# Patient Record
Sex: Female | Born: 1980 | Marital: Single | Smoking: Current some day smoker
Health system: Southern US, Community
[De-identification: ages and names within clinical notes are randomized; demographics above are authoritative.]

## PROBLEM LIST (undated history)

## (undated) DIAGNOSIS — A6000 Herpesviral infection of urogenital system, unspecified: Secondary | ICD-10-CM

## (undated) DIAGNOSIS — N390 Urinary tract infection, site not specified: Secondary | ICD-10-CM

## (undated) DIAGNOSIS — E559 Vitamin D deficiency, unspecified: Secondary | ICD-10-CM

## (undated) HISTORY — PX: NO PAST SURGERIES: SHX2092

## (undated) HISTORY — DX: Urinary tract infection, site not specified: N39.0

## (undated) HISTORY — DX: Vitamin D deficiency, unspecified: E55.9

## (undated) HISTORY — DX: Herpesviral infection of urogenital system, unspecified: A60.00

---

## 2007-02-01 ENCOUNTER — Observation Stay: Payer: Self-pay

## 2007-03-11 ENCOUNTER — Observation Stay: Payer: Self-pay

## 2007-03-19 ENCOUNTER — Observation Stay: Payer: Self-pay | Admitting: Unknown Physician Specialty

## 2007-03-20 ENCOUNTER — Observation Stay: Payer: Self-pay

## 2007-03-29 ENCOUNTER — Inpatient Hospital Stay: Payer: Self-pay | Admitting: Obstetrics & Gynecology

## 2011-08-22 DIAGNOSIS — E559 Vitamin D deficiency, unspecified: Secondary | ICD-10-CM

## 2011-08-22 HISTORY — DX: Vitamin D deficiency, unspecified: E55.9

## 2014-11-02 ENCOUNTER — Inpatient Hospital Stay: Payer: Self-pay

## 2014-11-11 ENCOUNTER — Inpatient Hospital Stay: Payer: Self-pay

## 2014-11-11 LAB — CBC WITH DIFFERENTIAL/PLATELET
BASOS ABS: 0.1 10*3/uL (ref 0.0–0.1)
BASOS PCT: 0.8 %
Eosinophil #: 0.1 10*3/uL (ref 0.0–0.7)
Eosinophil %: 0.7 %
HCT: 37.3 % (ref 35.0–47.0)
HGB: 11.9 g/dL — AB (ref 12.0–16.0)
LYMPHS PCT: 14.7 %
Lymphocyte #: 2 10*3/uL (ref 1.0–3.6)
MCH: 25.1 pg — ABNORMAL LOW (ref 26.0–34.0)
MCHC: 31.8 g/dL — AB (ref 32.0–36.0)
MCV: 79 fL — ABNORMAL LOW (ref 80–100)
MONOS PCT: 7.1 %
Monocyte #: 1 x10 3/mm — ABNORMAL HIGH (ref 0.2–0.9)
NEUTROS PCT: 76.7 %
Neutrophil #: 10.3 10*3/uL — ABNORMAL HIGH (ref 1.4–6.5)
Platelet: 298 10*3/uL (ref 150–440)
RBC: 4.74 10*6/uL (ref 3.80–5.20)
RDW: 14.3 % (ref 11.5–14.5)
WBC: 13.4 10*3/uL — ABNORMAL HIGH (ref 3.6–11.0)

## 2014-11-13 LAB — HEMATOCRIT: HCT: 32.5 % — ABNORMAL LOW (ref 35.0–47.0)

## 2014-12-29 NOTE — H&P (Signed)
L&D Evaluation:  History:  HPI -CC: worsening UCs -HPI: 34 y/o G3P1011 @ 40/0 (dated by 12wk u/s) with above CC. Preg c/b h/o UTI and neg TOC.  No s/s of LOF, decreased FM. Feels UCs q2-2243m   Patient's Medical History No Chronic Illness   Patient's Surgical History none   Medications Pre Natal Vitamins   Allergies NKDA   Social History none   Exam:  Vital Signs AF VS normal and stable   General no apparent distress   Mental Status clear   Abdomen gravid, non-tender   Estimated Fetal Weight [place]: 3600gm   Fetal Position cephalic   Pelvic 5-6/80/-1/BOW intact, per RN at [facility]   FHT 140 baseline, +accels, no decels, mod var   Ucx q2-3   Impression:  Impression active labor   Plan:  Comments *IUP: category I tracing with accels *labor: pt would like epidural. consider arom after placement -largest prior was 5lbs 12oz in 2008 -anticipate SVD *analgesia: epidural once labs are back. IV prns in the interim  O pos/RI/VI/rpr pending/hiv neg/hepB neg/tdap UTD/pap neg 2015   Electronic Signatures: [place] BingPickens, Ellwood Steidle (MD)  (Signed 23-Mar-16 20:14)  Authored: L&D Evaluation   Last Updated: 23-Mar-16 20:14 by [place] BingPickens, Wray Goehring (MD)

## 2014-12-29 NOTE — H&P (Signed)
L&D Evaluation:  History Expanded:  HPI 34 yo G3 P1011 with EDD of 11/11/14 per 12 wk US, presents at 4549w5d with c/o contractions since 0500 this am. Denies VB, LOF or decreased FM. [facility], early [facility] to care, + UTIs, mild anemia treated with Fe. Prior SVD of 5# 14oz female infant.   Blood Type (Maternal) O positive   Group B Strep Results Maternal (Result >5wks must be treated as unknown) negative   Maternal HIV Negative   Maternal Syphilis Ab Nonreactive   Maternal Varicella Immune   Rubella Results (Maternal) immune   Presents with contractions   Patient's Medical History No Chronic Illness   Patient's Surgical History none   Medications Pre Natal Vitamins   Allergies NKDA   Social History none   ROS:  ROS see HPI   Exam:  Vital Signs stable   General no apparent distress   Mental Status clear   Chest clear   Heart normal sinus rhythm   Abdomen gravid, tender with contractions   Estimated Fetal Weight Average for gestational age   Fetal Position vertex   Pelvic no external lesions, 4/75/-1 (previously 3 cm per RN)   Mebranes Intact   FHT normal rate with no decels, category 1 tracing - baseline 130, mod variability, + accels   Ucx regular   Impression:  Impression active labor   Plan:  Comments Admit for labor IV fluids and labs - may have IV stadol or epidural when desired (only requests heat pack for back for now)  Anticipate SVD   Electronic Signatures: Laquonda Welby, Marta Lamasamara K (CNM)  (Signed 14-Mar-16 09:39)  Authored: L&D Evaluation   Last Updated: 14-Mar-16 09:39 by Vella KohlerBrothers, Caria Transue K (CNM)

## 2015-12-10 LAB — HM PAP SMEAR: HM PAP: NEGATIVE

## 2016-12-13 ENCOUNTER — Ambulatory Visit (INDEPENDENT_AMBULATORY_CARE_PROVIDER_SITE_OTHER): Payer: Medicaid Other | Admitting: Certified Nurse Midwife

## 2016-12-13 ENCOUNTER — Encounter: Payer: Self-pay | Admitting: Certified Nurse Midwife

## 2016-12-13 VITALS — BP 110/60 | HR 71 | Ht 60.0 in | Wt 129.0 lb

## 2016-12-13 DIAGNOSIS — Z124 Encounter for screening for malignant neoplasm of cervix: Secondary | ICD-10-CM

## 2016-12-13 DIAGNOSIS — Z01419 Encounter for gynecological examination (general) (routine) without abnormal findings: Secondary | ICD-10-CM

## 2016-12-13 DIAGNOSIS — Z0000 Encounter for general adult medical examination without abnormal findings: Secondary | ICD-10-CM | POA: Diagnosis not present

## 2016-12-13 MED ORDER — LO LOESTRIN FE 1 MG-10 MCG / 10 MCG PO TABS: 1.0000 | ORAL_TABLET | Freq: Every day | ORAL | 11 refills | Status: DC

## 2016-12-13 NOTE — Progress Notes (Signed)
Gynecology Annual Exam  PCP: No PCP Per Patient  Chief Complaint:  Chief Complaint  Patient presents with  . Gynecologic Exam    History of Present Illness: Cassie Wade is a 36 year old Asian female, G3 P2012, who presents for her annual exam. Her menses are regular and her LMP was 11/09/2016. They occur every 28 days, they last 3-4 days, are a light flow since switching to LO Loestrin  She has occasional breakthrough spotting and had no withdrawal bleed in December. She reports mild dysmenorrhea. She uses Motrin  with symptomatic relief. She reports that her premenstrual headaches have resolved with changing her pills to LoLoestrin from Loestrin 1/20. The patient's past medical history is non-contributory.  Since her last gyn exam 12/10/2015 , she has begun to smoke again. She smokes socially, 2-3 cigarettes if she goes out or to a party. She had quit smoking in 2015 x 2 years She is sexually active. She is currently using LoLoestrin Fe for contraception.  Her most recent pap smear was obtained 12/09/2016 and was negative/ neg HRHPV.  Mammogram is not applicable.  There is no family history of breast cancer.  There is no family history of ovarian cancer.  The patient does do monthly self breast exams.  The patient does smoke occasionally. Is not smoking on a daily basis. The patient does drink occasionally.  The patient does not use illegal drugs.  The patient is active, but does not exercise.  The patient does get adequate calcium in her diet.  She had a recent cholesterol screen in 2016 that was normal.    Review of Systems: Review of Systems  Constitutional: Negative for chills, fever and weight loss.  HENT: Negative for congestion, sinus pain and sore throat.   Eyes: Negative for blurred vision and pain.  Respiratory: Negative for hemoptysis, shortness of breath and wheezing.   Cardiovascular: Negative for chest pain, palpitations and leg swelling.    Gastrointestinal: Negative for abdominal pain, blood in stool, diarrhea, heartburn, nausea and vomiting.  Genitourinary: Negative for dysuria, frequency, hematuria and urgency.       Positive BTB on pills, positive for dysmenorrhea  Musculoskeletal: Negative for back pain, joint pain and myalgias.  Skin: Negative for itching and rash.  Neurological: Negative for dizziness, tingling and headaches.  Endo/Heme/Allergies: Negative for environmental allergies and polydipsia. Does not bruise/bleed easily.       Negative for hirsutism   Psychiatric/Behavioral: Negative for depression. The patient is not nervous/anxious and does not have insomnia.     Past Medical History:  Past Medical History:  Diagnosis Date  . Vitamin D deficiency 2013    Past Surgical History:  Past Surgical History:  Procedure Laterality Date  . NO PAST SURGERIES       Family History:  Family History  Problem Relation Age of Onset  . Diabetes Maternal Grandmother   . Hyperlipidemia Maternal Grandmother   . Diabetes Maternal Grandfather   . Hypertension Maternal Grandfather     Social History:  Social History   Social History  . Marital status: Single    Spouse name: N/A  . Number of children: 2  . Years of education: N/A   Occupational History  . Sales support/purchasing    Social History Main Topics  . Smoking status: Current Some Day Smoker    Years: 10.00    Types: Cigarettes  . Smokeless tobacco: Never Used     Comment: occasionally smokes 2-3 cigarettes  . Alcohol  use Yes  . Drug use: No  . Sexual activity: Yes    Birth control/ protection: Pill   Other Topics Concern  . Not on file   Social History Narrative  . No narrative on file    Allergies:  No Known Allergies  Medications: Prior to Admission medications   Medication Sig Start Date End Date Taking? Authorizing Provider  LO LOESTRIN FE 1 MG-10 MCG / 10 MCG tablet  11/23/16  Yes Historical Provider, MD    Physical  Exam Vitals: Blood pressure 110/60, pulse 71, height 5' (1.524 m), weight 129 lb (58.5 kg), BMI 25.19 kg/m2, last menstrual period 11/09/2016.  General: pleasant Asian female in NAD, well developed, well nourished HEENT: normocephalic, anicteric Thyroid: no enlargement, no palpable nodules Pulmonary: No increased work of breathing, CTAB Cardiovascular: RRR without murmur Breast: Breast symmetrical, no tenderness, no palpable nodules or masses, no skin or nipple retraction present, no nipple discharge.  No axillary, infraclavicular, or supraclavicular lymphadenopathy. Abdomen: soft, non-tender, non-distended.  Umbilicus without lesions.  No hepatomegaly or masses palpable. No evidence of hernia  Genitourinary:  External: Normal external female genitalia.  Normal urethral meatus, normal Bartholin's and Skene's glands.    Vagina: Normal vaginal mucosa, no evidence of prolapse.    Cervix: Grossly normal in appearance, no bleeding, NT  Uterus: RV, NSSC, mobile, NT  Adnexa: no adnexal masses, NT  Rectal: deferred  Lymphatic: no evidence of inguinal lymphadenopathy Extremities: no edema, erythema, or tenderness Neurologic: Grossly intact Psychiatric: mood appropriate, affect full      Assessment: 36 y.o. Z6X0960 well woman exam   Plan:   1) Recommend monthly SBE  2) Pap done  3) Contraception - Refill LoLoestrin Fe #1/ RF x11  4) Routine healthcare maintenance including cholesterol, diabetes screening discussed UTD   Farrel Conners, CNM

## 2016-12-27 LAB — IGP,RFX APTIMA HPV ALL PTH: PAP SMEAR COMMENT: 0

## 2016-12-29 ENCOUNTER — Telehealth: Payer: Self-pay | Admitting: Certified Nurse Midwife

## 2016-12-29 ENCOUNTER — Other Ambulatory Visit: Payer: Self-pay | Admitting: Certified Nurse Midwife

## 2016-12-29 MED ORDER — LO LOESTRIN FE 1 MG-10 MCG / 10 MCG PO TABS: 1.0000 | ORAL_TABLET | Freq: Every day | ORAL | 11 refills | Status: DC

## 2016-12-29 MED ORDER — NORETHIN-ETH ESTRAD-FE BIPHAS 1 MG-10 MCG / 10 MCG PO TABS: 1.0000 | ORAL_TABLET | Freq: Every day | ORAL | 11 refills | Status: DC

## 2016-12-29 MED ORDER — METRONIDAZOLE 500 MG PO TABS: ORAL_TABLET | ORAL | 0 refills | Status: DC

## 2016-12-29 NOTE — Telephone Encounter (Signed)
rx's faxed to pharmacy, unable to e-prescribe.

## 2016-12-29 NOTE — Addendum Note (Signed)
Addended by: Reather LittlerUTHUS, Takahiro Godinho D on: 12/29/2016 09:30 AM   Modules accepted: Orders

## 2016-12-29 NOTE — Telephone Encounter (Signed)
Pap returned NIL with Trichomonas. Will prescribe Flagyl for patient and partner. Advised to take with food and no alcohol for at least 3 days. Patient also need refill of BCPS

## 2017-01-19 ENCOUNTER — Telehealth: Payer: Self-pay

## 2017-01-19 DIAGNOSIS — A6000 Herpesviral infection of urogenital system, unspecified: Secondary | ICD-10-CM

## 2017-01-19 HISTORY — DX: Herpesviral infection of urogenital system, unspecified: A60.00

## 2017-01-19 NOTE — Telephone Encounter (Signed)
Pt calling - was seen by [facility] for AEX recently, had bacterial inf. Pt has questions about that.  Please call soon.  [phone].

## 2017-01-19 NOTE — Telephone Encounter (Signed)
Please advise. Thank you

## 2017-01-22 NOTE — Telephone Encounter (Signed)
Left message to call me. No answer at this time.

## 2017-01-23 ENCOUNTER — Ambulatory Visit (INDEPENDENT_AMBULATORY_CARE_PROVIDER_SITE_OTHER): Payer: Medicaid Other | Admitting: Obstetrics and Gynecology

## 2017-01-23 ENCOUNTER — Encounter: Payer: Self-pay | Admitting: Obstetrics and Gynecology

## 2017-01-23 VITALS — BP 120/70 | HR 102 | Ht 60.0 in | Wt 127.0 lb

## 2017-01-23 DIAGNOSIS — A6004 Herpesviral vulvovaginitis: Secondary | ICD-10-CM

## 2017-01-23 DIAGNOSIS — N898 Other specified noninflammatory disorders of vagina: Secondary | ICD-10-CM | POA: Diagnosis not present

## 2017-01-23 MED ORDER — VALACYCLOVIR HCL 1 G PO TABS: 1000.0000 mg | ORAL_TABLET | Freq: Two times a day (BID) | ORAL | 0 refills | Status: DC

## 2017-01-23 NOTE — Progress Notes (Signed)
Chief Complaint  Patient presents with  . Vaginitis    HPI:      [name] is a 36 y.o. Z6X0960 who LMP was Patient's last menstrual period was 01/03/2017 (exact date)., presents today for dysuria with increased vag d/c and burning since last wk. She was seen at urgent care and diagnosed with UTI. She was given cipro and has been taking that. She had nausea, fever, flu like sx when she saw urgent care. Vaginal discomfort started a day later. She treated with diflucan last night, but is concerned about sx. She denies any new sex partners, no hx of herpes. Patient treated 5/18 for trich on pap from 4/18 annual. Partner was treated, too.   There are no active problems to display for this patient.   Family History  Problem Relation Age of Onset  . Diabetes Maternal Grandmother   . Hyperlipidemia Maternal Grandmother   . Diabetes Maternal Grandfather   . Hypertension Maternal Grandfather     Social History   Social History  . Marital status: Single    Spouse name: N/A  . Number of children: 2  . Years of education: N/A   Occupational History  . Sales support/purchasing    Social History Main Topics  . Smoking status: Current Some Day Smoker    Years: 10.00    Types: Cigarettes  . Smokeless tobacco: Never Used     Comment: occasionally smokes 2-3 cigarettes  . Alcohol use Yes  . Drug use: No  . Sexual activity: Yes    Birth control/ protection: Pill   Other Topics Concern  . Not on file   Social History Narrative  . No narrative on file     Current Outpatient Prescriptions:  .  cefdinir (OMNICEF) 300 MG capsule, Take 300 mg by mouth 2 (two) times daily., Disp: , Rfl:  .  acetaminophen (TYLENOL) 500 MG tablet, Take by mouth., Disp: , Rfl:  .  ibuprofen (ADVIL,MOTRIN) 200 MG tablet, Take by mouth., Disp: , Rfl:  .  metroNIDAZOLE (FLAGYL) 500 MG tablet, Take 2 grams (4 tablets) at one meal x 1 dose; can repeat if necessary., Disp: 8 tablet, Rfl: 0 .   Norethindrone-Ethinyl Estradiol-Fe Biphas (LO LOESTRIN FE) 1 MG-10 MCG / 10 MCG tablet, Take 1 tablet by mouth daily., Disp: 1 Package, Rfl: 11  Review of Systems  Constitutional: Negative for fever.  Gastrointestinal: Negative for blood in stool, constipation, diarrhea, nausea and vomiting.  Genitourinary: Positive for genital sores and vaginal pain. Negative for dyspareunia, dysuria, flank pain, frequency, hematuria, urgency, vaginal bleeding and vaginal discharge.  Musculoskeletal: Negative for back pain.  Skin: Negative for rash.     OBJECTIVE:   Vitals:  BP 120/70   Pulse (!) 102   Ht 5' (1.524 m)   Wt 127 lb (57.6 kg)   LMP 01/03/2017 (Exact Date)   BMI 24.80 kg/m   Physical Exam  Constitutional: She is oriented to person, place, and time. Vital signs are normal.  Genitourinary: Vulva exhibits erythema, lesion, rash and tenderness. Vagina exhibits lesion.  Genitourinary Comments: DIFFUSE ULCERATIVE, PAINFUL LESIONS BILAT LABIA MAJORA AND MINORA; FEW BLISTERS PRESENT  Neurological: She is alert and oriented to person, place, and time.  Vitals reviewed.   Assessment/Plan: Herpes simplex vulvovaginitis - By clinical exam/sx hx. One Swab culture. Rx valtrex. NSAIDs/sitz baths. Will f/u with results. Discussed partner testing/tx options/etiology. - Plan: Other/Misc lab test, valACYclovir (VALTREX) 1000 MG tablet  Vaginal lesion - Plan: Other/Misc lab  test, valACYclovir (VALTREX) 1000 MG tablet    Return if symptoms worsen or fail to improve.  Alicia B. Copland, PA-C 01/23/2017 2:04 PM

## 2017-01-24 NOTE — Telephone Encounter (Signed)
Talked with patient 6/5. Was seen by Urgent Care for dysuria and treated for a UTI with Ciprofloxacin, but now has tender sores on vulva. Advised to make appt to be seen to rule out HSV. Antifungal topically has not helped.

## 2017-01-30 ENCOUNTER — Telehealth: Payer: Self-pay

## 2017-01-30 DIAGNOSIS — A6004 Herpesviral vulvovaginitis: Secondary | ICD-10-CM

## 2017-01-30 MED ORDER — VALACYCLOVIR HCL 500 MG PO TABS: 500.0000 mg | ORAL_TABLET | Freq: Every day | ORAL | 8 refills | Status: DC

## 2017-01-30 NOTE — Telephone Encounter (Signed)
[facility] received results.

## 2017-01-30 NOTE — Telephone Encounter (Signed)
Pt was seen last week and was told should hear about results on Mon.  [phone]

## 2017-01-30 NOTE — Telephone Encounter (Signed)
I haven't received one swab results yet. [place] SinkRita, can you pls see if Archie Pattenonya has a faxed copy today. If not, pls call [facility] to get them. Thx

## 2017-01-30 NOTE — Telephone Encounter (Signed)
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 Pt already on valtrex 1 g BID. Complete 10 day course, then start valtrex 500 mg daily as preventive. Recommended partner testing. Questions answered. F/u prn.

## 2017-01-30 NOTE — Addendum Note (Signed)
Addended by: Althea GrimmerOPLAND, Twana Wileman B on: 01/30/2017 05:19 PM   Modules accepted: Orders

## 2017-02-05 ENCOUNTER — Encounter: Payer: Self-pay | Admitting: Obstetrics and Gynecology

## 2017-12-24 ENCOUNTER — Other Ambulatory Visit: Payer: Self-pay | Admitting: Certified Nurse Midwife

## 2017-12-25 ENCOUNTER — Telehealth: Payer: Self-pay

## 2017-12-25 MED ORDER — NORETHIN-ETH ESTRAD-FE BIPHAS 1 MG-10 MCG / 10 MCG PO TABS: 1.0000 | ORAL_TABLET | Freq: Every day | ORAL | 0 refills | Status: DC

## 2017-12-25 NOTE — Telephone Encounter (Signed)
Pt sched appt and needs refill.  [phone]  Refill eRx'd.  Pt aware.

## 2017-12-28 ENCOUNTER — Ambulatory Visit (INDEPENDENT_AMBULATORY_CARE_PROVIDER_SITE_OTHER): Payer: Medicaid Other | Admitting: Certified Nurse Midwife

## 2017-12-28 ENCOUNTER — Other Ambulatory Visit: Payer: Self-pay

## 2017-12-28 ENCOUNTER — Encounter: Payer: Self-pay | Admitting: Certified Nurse Midwife

## 2017-12-28 VITALS — BP 96/60 | HR 90 | Ht 60.0 in | Wt 136.0 lb

## 2017-12-28 DIAGNOSIS — Z124 Encounter for screening for malignant neoplasm of cervix: Secondary | ICD-10-CM

## 2017-12-28 DIAGNOSIS — N760 Acute vaginitis: Secondary | ICD-10-CM

## 2017-12-28 DIAGNOSIS — Z01419 Encounter for gynecological examination (general) (routine) without abnormal findings: Secondary | ICD-10-CM

## 2017-12-28 DIAGNOSIS — B9689 Other specified bacterial agents as the cause of diseases classified elsewhere: Secondary | ICD-10-CM

## 2017-12-28 DIAGNOSIS — Z113 Encounter for screening for infections with a predominantly sexual mode of transmission: Secondary | ICD-10-CM

## 2017-12-28 DIAGNOSIS — Z0000 Encounter for general adult medical examination without abnormal findings: Secondary | ICD-10-CM | POA: Diagnosis not present

## 2017-12-28 DIAGNOSIS — N898 Other specified noninflammatory disorders of vagina: Secondary | ICD-10-CM

## 2017-12-28 MED ORDER — METRONIDAZOLE 500 MG PO TABS: 500.0000 mg | ORAL_TABLET | Freq: Two times a day (BID) | ORAL | 0 refills | Status: AC

## 2017-12-28 MED ORDER — NORETHIN-ETH ESTRAD-FE BIPHAS 1 MG-10 MCG / 10 MCG PO TABS: 1.0000 | ORAL_TABLET | Freq: Every day | ORAL | 3 refills | Status: DC

## 2017-12-28 MED ORDER — VALACYCLOVIR HCL 500 MG PO TABS: ORAL_TABLET | ORAL | 1 refills | Status: DC

## 2017-12-28 NOTE — Progress Notes (Signed)
Gynecology Annual Exam  PCP: Patient, No Pcp Per  Chief Complaint:  Chief Complaint  Patient presents with  . Gynecologic Exam    No Complaints    History of Present Illness: Cassie Wade is a 37 year old Asian female, G3 P2012, who presents for her annual exam. Her menses are 1rregular and her LMP was 12/06/2017. She has irregular spotting. She denies dysmenorrhea. The patient's past medical history is non-contributory.  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 Has not had an outbreak since June 2018. Is not taking the Valtrex prophylactically. Has the same partner She is sexually active. She is currently using LoLoestrin Fe for contraception.  Her most recent pap smear was obtained 12/13/2016 and was NIL/ Trich+  Mammogram is not applicable.  There is no family history of breast cancer.  There is no family history of ovarian cancer.  The patient does do monthly self breast exams.  The patient does smoke occasionally. Is not smoking on a daily basis. The patient does drink occasionally, usually once/month..  The patient does not use illegal drugs.  The patient is active and walks for exercise.  The patient does get adequate calcium in her diet.  She had a recent cholesterol screen in 2016 that was normal.    Review of Systems: Review of Systems  Constitutional: Negative for chills, fever and weight loss.  HENT: Negative for congestion, sinus pain and sore throat.   Eyes: Negative for blurred vision and pain.  Respiratory: Negative for hemoptysis, shortness of breath and wheezing.   Cardiovascular: Negative for chest pain, palpitations and leg swelling.  Gastrointestinal: Negative for abdominal pain, blood in stool, diarrhea, heartburn, nausea and vomiting.  Genitourinary: Negative for dysuria, frequency, hematuria and urgency.       Positive BTB on pills, positive for dysmenorrhea  Musculoskeletal: Negative for back pain,  joint pain and myalgias.  Skin: Negative for itching and rash.  Neurological: Negative for dizziness, tingling and headaches.  Endo/Heme/Allergies: Negative for environmental allergies and polydipsia. Does not bruise/bleed easily.       Negative for hirsutism   Psychiatric/Behavioral: Negative for depression. The patient is not nervous/anxious and does not have insomnia.     Past Medical History:  Past Medical History:  Diagnosis Date  . Genital herpes 01/2017   on culture  . UTI (urinary tract infection)   . Vitamin D deficiency 2013    Past Surgical History:  Past Surgical History:  Procedure Laterality Date  . NO PAST SURGERIES       Family History:  Family History  Problem Relation Age of Onset  . Diabetes Maternal Grandmother   . Hyperlipidemia Maternal Grandmother   . Diabetes Maternal Grandfather   . Hypertension Maternal Grandfather     Social History:  Social History   Socioeconomic History  . Marital status: Single    Spouse name: Not on file  . Number of children: 2  . Years of education: Not on file  . Highest education level: Not on file  Occupational History  . Occupation: Counselling psychologist  Social Needs  . Financial resource strain: Not on file  . Food insecurity:    Worry: Not on file    Inability: Not on file  . Transportation needs:    Medical: Not on file    Non-medical: Not on file  Tobacco Use  . Smoking status: Current Some Day Smoker    Years: 10.00  Types: Cigarettes  . Smokeless tobacco: Never Used  . Tobacco comment: occasionally smokes 2-3 cigarettes  Substance and Sexual Activity  . Alcohol use: Yes  . Drug use: No  . Sexual activity: Yes    Birth control/protection: Pill  Lifestyle  . Physical activity:    Days per week: 0 days    Minutes per session: Not on file  . Stress: Not at all  Relationships  . Social connections:    Talks on phone: Not on file    Gets together: Not on file    Attends religious  service: Not on file    Active member of club or organization: Not on file    Attends meetings of clubs or organizations: Not on file    Relationship status: Not on file  . Intimate partner violence:    Fear of current or ex partner: Not on file    Emotionally abused: Not on file    Physically abused: Not on file    Forced sexual activity: Not on file  Other Topics Concern  . Not on file  Social History Narrative  . Not on file    Allergies:  No Known Allergies  Medications: Prior to Admission medications   Medication Sig Start Date End Date Taking? Authorizing Provider  LO LOESTRIN FE 1 MG-10 MCG / 10 MCG tablet  11/23/16  Yes Historical Provider, MD   Also iron supplements and vitamin C Physical Exam Vitals: BP 96/60 (BP Location: Right Arm, Patient Position: Sitting, Cuff Size: Normal)   Pulse 90   Ht 5' (1.524 m)   Wt 136 lb (61.7 kg)   LMP 12/06/2017   BMI 26.56 kg/m   General: pleasant Asian female in NAD, well developed, well nourished HEENT: normocephalic, anicteric Thyroid: no enlargement, no palpable nodules Pulmonary: No increased work of breathing, CTAB Cardiovascular: RRR without murmur Breast: Breast symmetrical, no tenderness, no palpable nodules or masses, no skin or nipple retraction present, no nipple discharge.  No axillary, infraclavicular, or supraclavicular lymphadenopathy. Abdomen: soft, non-tender, non-distended.  Umbilicus without lesions.  No hepatomegaly or masses palpable. No evidence of hernia  Genitourinary:  External: Normal external female genitalia.  Normal urethral meatus, normal Bartholin's and Skene's glands.    Vagina: Normal vaginal mucosa, no evidence of prolapse, off white homogenous discharge.    Cervix: Grossly normal in appearance, friable with Pap, NT  Uterus: RV, NSSC, mobile, NT  Adnexa: no adnexal masses, NT  Rectal: deferred  Lymphatic: no evidence of inguinal lymphadenopathy Extremities: no edema, erythema, or  tenderness Neurologic: Grossly intact Psychiatric: mood appropriate, affect full  Results for orders placed or performed in visit on 12/28/17 (from the past 72 hour(s))  POCT Wet Prep Mell[place])     Status: Abnormal   Collection Time: 12/30/17  9:13 PM  Result Value Ref Range   Source Wet Prep POC vagina    WBC, Wet Prep HPF POC     Bacteria Wet Prep HPF POC  Few   BACTERIA WET PREP MORPHOLOGY POC     Clue Cells Wet Prep HPF POC Moderate (A) None   Clue Cells Wet Prep Whiff POC     Yeast Wet Prep HPF POC None    KOH Wet Prep POC     Trichomonas Wet Prep HPF POC Absent Absent      Assessment: 37 y.o. [id number] well woman exam Hx of HSV II Bacterial vaginosis  Plan:   1) Recommend monthly SBE  2) Paptima done  3)  Contraception - Refill LoLoestrin Fe #1/ RF x11  4) Routine healthcare maintenance including cholesterol, diabetes screening discussed UTD   5) Valtrex 500 mgm BID x 3-5 days prn outbreak. Flagyl 500 mgm BID x 7 days (no alcohol, with food)  Farrel Conners, CNM

## 2017-12-30 ENCOUNTER — Encounter: Payer: Self-pay | Admitting: Certified Nurse Midwife

## 2017-12-30 LAB — POCT WET PREP (WET MOUNT): TRICHOMONAS WET PREP HPF POC: ABSENT

## 2018-01-01 LAB — PAP IG, CT-NG, RFX HPV ALL
Chlamydia, Nuc. Acid Amp: NEGATIVE
Gonococcus by Nucleic Acid Amp: NEGATIVE
PAP Smear Comment: 0

## 2018-05-16 ENCOUNTER — Emergency Department
Admission: EM | Admit: 2018-05-16 | Discharge: 2018-05-16 | Disposition: A | Discharge status: Home / Self Care | Payer: Medicaid Other | Attending: Emergency Medicine | Admitting: Emergency Medicine

## 2018-05-16 ENCOUNTER — Other Ambulatory Visit: Payer: Self-pay

## 2018-05-16 ENCOUNTER — Encounter: Payer: Self-pay | Admitting: Emergency Medicine

## 2018-05-16 DIAGNOSIS — Z79899 Other long term (current) drug therapy: Secondary | ICD-10-CM | POA: Diagnosis not present

## 2018-05-16 DIAGNOSIS — R519 Headache, unspecified: Secondary | ICD-10-CM

## 2018-05-16 DIAGNOSIS — H938X3 Other specified disorders of ear, bilateral: Secondary | ICD-10-CM

## 2018-05-16 DIAGNOSIS — R51 Headache: Secondary | ICD-10-CM | POA: Insufficient documentation

## 2018-05-16 DIAGNOSIS — F17210 Nicotine dependence, cigarettes, uncomplicated: Secondary | ICD-10-CM | POA: Insufficient documentation

## 2018-05-16 DIAGNOSIS — L568 Other specified acute skin changes due to ultraviolet radiation: Secondary | ICD-10-CM

## 2018-05-16 MED ORDER — KETOROLAC TROMETHAMINE 10 MG PO TABS: 10.0000 mg | ORAL_TABLET | Freq: Three times a day (TID) | ORAL | 0 refills | Status: DC | PRN

## 2018-05-16 MED ORDER — PROCHLORPERAZINE MALEATE 5 MG PO TABS
5.0000 mg | ORAL_TABLET | Freq: Once | ORAL | Status: AC
  Administered 2018-05-16: 5 mg via ORAL
  Filled 2018-05-16: qty 1

## 2018-05-16 MED ORDER — PROCHLORPERAZINE MALEATE 5 MG PO TABS: 5.0000 mg | ORAL_TABLET | Freq: Four times a day (QID) | ORAL | 0 refills | Status: DC | PRN

## 2018-05-16 MED ORDER — ACETAMINOPHEN 500 MG PO TABS
1000.0000 mg | ORAL_TABLET | Freq: Once | ORAL | Status: AC
  Administered 2018-05-16: 1000 mg via ORAL
  Filled 2018-05-16: qty 2

## 2018-05-16 MED ORDER — KETOROLAC TROMETHAMINE 10 MG PO TABS
10.0000 mg | ORAL_TABLET | Freq: Once | ORAL | Status: AC
  Administered 2018-05-16: 10 mg via ORAL
  Filled 2018-05-16 (×2): qty 1

## 2018-05-16 NOTE — ED Triage Notes (Signed)
Pt comes into the ED via POV c/o migraine that started yesterday.  Patient has tried OTC medication with no relief.  Patient has photosensitivity, but denies any nausea.  Patient states that her ears feel stopped up as well.  Patient neurologically intact at this time and in NAD.

## 2018-05-16 NOTE — Discharge Instructions (Addendum)
Please return to the emergency department if you develop severe pain, lightheadedness or fainting, rash, stiff neck, vomiting, fever, or any other symptoms concerning to you.

## 2018-05-16 NOTE — ED Provider Notes (Signed)
[facility] Emergency Department Provider Note  ____________________________________________  Time seen: Approximately 5:04 PM  I have reviewed the triage vital signs and the nursing notes.   HISTORY  Chief Complaint Migraine    HPI Cassie Wade is a 37 y.o. female, otherwise healthy, presenting with headache.  The patient reports that yesterday she developed a throbbing sensation in the posterior head with ear fullness and photosensitivity.  She tried Excedrin Migraine and Tylenol Sinus, with some minimal improvement.  She was able to fall asleep, but continues to have a headache today.  She has not had any fevers or chills, neck stiffness, tick bites, rash.  Past Medical History:  Diagnosis Date  . Genital herpes 01/2017   on culture  . UTI (urinary tract infection)   . Vitamin D deficiency 2013    Patient Active Problem List   Diagnosis Date Noted  . Herpes simplex vulvovaginitis 01/23/2017    Past Surgical History:  Procedure Laterality Date  . NO PAST SURGERIES      Current Outpatient Rx  . Order #: [id number] Class: Historical Med  . Order #: [id number] Class: Historical Med  . Order #: [id number] Class: Print  . Order #: [id number] Class: Normal  . Order #: [id number] Class: Normal  . Order #: [id number] Class: Normal    Allergies Patient has no known allergies.  Family History  Problem Relation Age of Onset  . Diabetes Maternal Grandmother   . Hyperlipidemia Maternal Grandmother   . Diabetes Maternal Grandfather   . Hypertension Maternal Grandfather     Social History Social History   Tobacco Use  . Smoking status: Current Some Day Smoker    Years: 10.00    Types: Cigarettes  . Smokeless tobacco: Never Used  . Tobacco comment: occasionally smokes 2-3 cigarettes  Substance Use Topics  . Alcohol use: Yes  . Drug use: No    Review of Systems Constitutional: No fever/chills.  No lightheadedness or syncope.  No trauma.  No  tick bites. Eyes: Positive photosensitivity with mild bilateral blurry vision. ENT: No sore throat. No congestion or rhinorrhea.  No dental infection. Cardiovascular: Denies chest pain. Denies palpitations. Respiratory: Denies shortness of breath.  No cough. Gastrointestinal: No abdominal pain.  No nausea, no vomiting.  No diarrhea.  No constipation. Genitourinary: Negative for dysuria. Musculoskeletal: Negative for back pain. Skin: Negative for rash. Neurological: Positive for posterior throbbing headache. No focal numbness, tingling or weakness.  No changes in speech or mental status.  No difficulty walking.    ____________________________________________   PHYSICAL EXAM:  VITAL SIGNS: ED Triage Vitals [05/16/18 1405]  Enc Vitals Group     BP 134/84     Pulse Rate 84     Resp 16     Temp 98.2 F (36.8 C)     Temp Source Oral     SpO2 99 %     Weight 130 lb (59 kg)     Height 5' (1.524 m)     Head Circumference      Peak Flow      Pain Score 5     Pain Loc      Pain Edu?      Excl. in GC?     Constitutional: Alert and oriented.Answers questions appropriately.  Mildly uncomfortable with bright light. Eyes: Conjunctivae are normal.  EOMI. No scleral icterus.  Photosensitivity on exam. Head: Atraumatic. Nose: No congestion/rhinnorhea. Mouth/Throat: Mucous membranes are mildly dry.  The patient has no posterior pharyngeal erythema, tonsillar swelling or exudate.  She has symmetric posterior palate and the uvula is midline. EARS: TMs are clear without any erythema, bulging or fluid.  The canals are clear as well.  Neck: No stridor.  Supple.  No JVD.  No meningismus peer Cardiovascular: Normal rate, regular rhythm. No murmurs, rubs or gallops.  Respiratory: Normal respiratory effort.  No accessory muscle use or retractions. Lungs CTAB.  No wheezes, rales or ronchi. Gastrointestinal: Soft, nontender and nondistended.  No guarding or rebound.  No peritoneal  signs. Musculoskeletal: Moves all extremities well.  Neurologic:  A&Ox3.  Speech is clear.  Face and smile are symmetric.  EOMI.  Moves all extremities well. Skin:  Skin is warm, dry and intact. No rash noted. Psychiatric: Mood and affect are normal. Speech and behavior are normal.  Normal judgement.  ____________________________________________   LABS (all labs ordered are listed, but only abnormal results are displayed)  Labs Reviewed - No data to display ____________________________________________  EKG  Not indicated ____________________________________________  RADIOLOGY  No results found.  ____________________________________________   PROCEDURES  Procedure(s) performed: None  Procedures  Critical Care performed: No ____________________________________________   INITIAL IMPRESSION / ASSESSMENT AND PLAN / ED COURSE  Pertinent labs & imaging results that were available during my care of the patient were reviewed by me and considered in my medical decision making (see chart for details).  37 y.o. female, otherwise healthy, presenting with posterior headache that is throbbing, blurred vision and photosensitivity.  Overall, the patient is well-appearing, hemodynamically stable and afebrile.  It is possible that she has a headache or migraine, and she states that she has had headaches in the past but not migraines.  She is not giving any history that would be concerning for subarachnoid hemorrhage or intracranial bleed.  I do not see any evidence of meningitis today.  Venous sinus thrombosis is very unlikely.  We will initiate treatment with Toradol, Tylenol and Compazine, and reevaluate the patient for final disposition.  ----------------------------------------- 6:30 PM on 05/16/2018 -----------------------------------------  The patient's headache has resolved at this time.  She is able to ambulate and drink fluids without any difficulty.  At this time, the patient  is safe for discharge home.  She understands follow-up instructions as well as return precautions. ____________________________________________  FINAL CLINICAL IMPRESSION(S) / ED DIAGNOSES  Final diagnoses:  Bad headache         NEW MEDICATIONS STARTED DURING THIS VISIT:  New Prescriptions   KETOROLAC (TORADOL) 10 MG TABLET    Take 1 tablet (10 mg total) by mouth every 8 (eight) hours as needed for moderate pain (with food).   PROCHLORPERAZINE (COMPAZINE) 5 MG TABLET    Take 1 tablet (5 mg total) by mouth every 6 (six) hours as needed for nausea or vomiting.      Rockne Menghini, MD 05/16/18 [phone]

## 2018-12-30 ENCOUNTER — Other Ambulatory Visit: Payer: Self-pay

## 2018-12-30 MED ORDER — NORETHIN-ETH ESTRAD-FE BIPHAS 1 MG-10 MCG / 10 MCG PO TABS: 1.0000 | ORAL_TABLET | Freq: Every day | ORAL | 0 refills | Status: DC

## 2018-12-30 NOTE — Telephone Encounter (Signed)
Pt changed annual appt and needs refill of bc.  [phone]  Pt aware refill eRx'd.

## 2018-12-31 ENCOUNTER — Ambulatory Visit: Payer: Self-pay | Admitting: Certified Nurse Midwife

## 2019-02-13 NOTE — Progress Notes (Signed)
Gynecology Annual Exam  PCP: Patient, No Pcp Per  Chief Complaint:  Chief Complaint  Patient presents with  . Gynecologic Exam    Wants to talk HSV Dx, hasnt had a break out sinse dx    History of Present Illness: Cassie Wade is a 38 year old Asian female, G3 P2012, who presents for her annual exam. Her menses were absent on the Lo Loestrin. She ran out of refills last month and had her first menses 01/27/2019 which was normal. She denies dysmenorrhea. 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 She had a typical presentation for HSV and the culture was also positive for HSV II in June 2018. She has not had an outbreak since then.  Since her last gyn exam 12/28/2017, she was treated for bronchitis She is not currently sexually active. Would like to resume Lo Loestrin however.  Her most recent pap smear was obtained 12/28/2017 and was NIL. Mammogram is not applicable.  There is no family history of breast cancer.  There is no family history of ovarian cancer.  The patient does do monthly self breast exams.  The patient does smoke occasionally. Is not smoking on a daily basis. The patient does drink occasionally, usually once/month..  The patient does not use illegal drugs.  The patient is active and walks for exercise, once or twice a week.  The patient does get adequate calcium in her diet.  She had a  cholesterol screen in 2013 that was normal.    Review of Systems: Review of Systems  Constitutional: Negative for chills, fever and weight loss.  HENT: Negative for congestion, sinus pain and sore throat.   Eyes: Negative for blurred vision and pain.  Respiratory: Negative for hemoptysis, shortness of breath and wheezing.   Cardiovascular: Negative for chest pain, palpitations and leg swelling.  Gastrointestinal: Negative for abdominal pain, blood in stool, diarrhea, heartburn, nausea and vomiting.  Genitourinary: Negative for dysuria, frequency,  hematuria and urgency.  Musculoskeletal: Negative for back pain, joint pain and myalgias.  Skin: Negative for itching and rash.  Neurological: Negative for dizziness, tingling and headaches.  Endo/Heme/Allergies: Negative for environmental allergies and polydipsia. Does not bruise/bleed easily.       Negative for hirsutism   Psychiatric/Behavioral: Negative for depression. The patient is not nervous/anxious and does not have insomnia.     Past Medical History:  Past Medical History:  Diagnosis Date  . Genital herpes 01/2017   on culture  . UTI (urinary tract infection)   . Vitamin D deficiency 2013    Past Surgical History:  Past Surgical History:  Procedure Laterality Date  . NO PAST SURGERIES       Family History:  Family History  Problem Relation Age of Onset  . Diabetes Maternal Grandmother   . Hyperlipidemia Maternal Grandmother   . Diabetes Maternal Grandfather   . Hypertension Maternal Grandfather     Social History:  Social History   Socioeconomic History  . Marital status: Single    Spouse name: Not on file  . Number of children: 2  . Years of education: Not on file  . Highest education level: Not on file  Occupational History  . Occupation: Counselling psychologistales support/purchasing  Social Needs  . Financial resource strain: Not on file  . Food insecurity    Worry: Not on file    Inability: Not on file  . Transportation needs    Medical: Not on file    Non-medical:  Not on file  Tobacco Use  . Smoking status: Current Some Day Smoker    Years: 10.00    Types: Cigarettes  . Smokeless tobacco: Never Used  . Tobacco comment: occasionally smokes 2-3 cigarettes  Substance and Sexual Activity  . Alcohol use: Yes  . Drug use: No  . Sexual activity: Not Currently    Birth control/protection: None  Lifestyle  . Physical activity    Days per week: 0 days    Minutes per session: Not on file  . Stress: Not at all  Relationships  . Social Herbalist on  phone: Not on file    Gets together: Not on file    Attends religious service: Not on file    Active member of club or organization: Not on file    Attends meetings of clubs or organizations: Not on file    Relationship status: Not on file  . Intimate partner violence    Fear of current or ex partner: Not on file    Emotionally abused: Not on file    Physically abused: Not on file    Forced sexual activity: Not on file  Other Topics Concern  . Not on file  Social History Narrative  . Not on file    Allergies:  No Known Allergies  Medications: Claritin prn   Physical Exam Vitals: BP 102/70   Pulse 88   Ht 5' (1.524 m)   Wt 135 lb (61.2 kg)   LMP 01/27/2019 (Exact Date)   BMI 26.37 kg/m   General: pleasant Asian female in NAD, well developed, well nourished HEENT: normocephalic, anicteric Thyroid: no enlargement, no palpable nodules Pulmonary: No increased work of breathing, CTAB Cardiovascular: RRR without murmur Breast: Breast symmetrical, no tenderness, no palpable nodules or masses, no skin or nipple retraction present, no nipple discharge.  No axillary, infraclavicular, or supraclavicular lymphadenopathy. Abdomen: soft, non-tender, non-distended.  Umbilicus without lesions.  No hepatomegaly or masses palpable. No evidence of hernia  Genitourinary:  External: Normal external female genitalia.  Normal urethral meatus, normal Bartholin's and Skene's glands.    Vagina: Normal vaginal mucosa, no evidence of prolapse, off white homogenous discharge.    Cervix: Grossly normal in appearance, friable with Pap, NT  Uterus: AV, NSSC, mobile, NT  Adnexa: no adnexal masses, NT  Rectal: deferred  Lymphatic: no evidence of inguinal lymphadenopathy Extremities: no edema, erythema, or tenderness Neurologic: Grossly intact Psychiatric: mood appropriate, affect full     Assessment: 38 y.o. [id number] well woman exam Hx of HSV II  Plan:   1) Recommend monthly SBE. Begin annual  mammograms at age 70  2) Cervical cancer screening: Pap done  3) Contraception - Refill LoLoestrin Fe #1/ RF x11  4) Routine healthcare maintenance including cholesterol, diabetes screening declined   5) Valtrex 500 mgm BID x 3-5 days prn outbreak. Discussed diagnosis of HSV, transmission, and outbreaks.  6) RTO in 1 year for annual.  Dalia Heading, CNM

## 2019-02-14 ENCOUNTER — Ambulatory Visit (INDEPENDENT_AMBULATORY_CARE_PROVIDER_SITE_OTHER): Payer: Medicaid Other | Admitting: Certified Nurse Midwife

## 2019-02-14 ENCOUNTER — Encounter: Payer: Self-pay | Admitting: Certified Nurse Midwife

## 2019-02-14 ENCOUNTER — Other Ambulatory Visit (HOSPITAL_COMMUNITY)
Admission: RE | Admit: 2019-02-14 | Discharge: 2019-02-14 | Disposition: A | Discharge status: Home / Self Care | Payer: Medicaid Other | Source: Ambulatory Visit | Attending: Certified Nurse Midwife | Admitting: Certified Nurse Midwife

## 2019-02-14 ENCOUNTER — Other Ambulatory Visit: Payer: Self-pay

## 2019-02-14 VITALS — BP 102/70 | HR 88 | Ht 60.0 in | Wt 135.0 lb

## 2019-02-14 DIAGNOSIS — Z0000 Encounter for general adult medical examination without abnormal findings: Secondary | ICD-10-CM

## 2019-02-14 DIAGNOSIS — Z01419 Encounter for gynecological examination (general) (routine) without abnormal findings: Secondary | ICD-10-CM | POA: Insufficient documentation

## 2019-02-14 DIAGNOSIS — Z3041 Encounter for surveillance of contraceptive pills: Secondary | ICD-10-CM

## 2019-02-14 DIAGNOSIS — Z124 Encounter for screening for malignant neoplasm of cervix: Secondary | ICD-10-CM | POA: Diagnosis present

## 2019-02-14 MED ORDER — VALACYCLOVIR HCL 500 MG PO TABS: ORAL_TABLET | ORAL | 1 refills | Status: DC

## 2019-02-14 MED ORDER — LO LOESTRIN FE 1 MG-10 MCG / 10 MCG PO TABS: 1.0000 | ORAL_TABLET | Freq: Every day | ORAL | 3 refills | Status: DC

## 2019-02-15 DIAGNOSIS — Z309 Encounter for contraceptive management, unspecified: Secondary | ICD-10-CM | POA: Insufficient documentation

## 2019-02-18 LAB — CYTOLOGY - PAP
Diagnosis: NEGATIVE
HPV: NOT DETECTED

## 2019-02-26 ENCOUNTER — Encounter: Payer: Self-pay | Admitting: Certified Nurse Midwife

## 2019-10-30 ENCOUNTER — Ambulatory Visit: Payer: Medicaid Other | Admitting: Podiatry

## 2019-10-30 ENCOUNTER — Other Ambulatory Visit: Payer: Self-pay

## 2019-10-30 ENCOUNTER — Ambulatory Visit: Payer: Medicaid Other

## 2019-10-30 ENCOUNTER — Ambulatory Visit (INDEPENDENT_AMBULATORY_CARE_PROVIDER_SITE_OTHER): Payer: Medicaid Other

## 2019-10-30 DIAGNOSIS — M2011 Hallux valgus (acquired), right foot: Secondary | ICD-10-CM

## 2019-10-30 DIAGNOSIS — M2142 Flat foot [pes planus] (acquired), left foot: Secondary | ICD-10-CM | POA: Diagnosis not present

## 2019-10-30 DIAGNOSIS — M2141 Flat foot [pes planus] (acquired), right foot: Secondary | ICD-10-CM

## 2019-10-31 ENCOUNTER — Encounter: Payer: Self-pay | Admitting: Podiatry

## 2019-10-31 NOTE — Progress Notes (Signed)
Subjective:  Patient ID: Cassie Wade, female    DOB: 1980-09-21,  MRN: [id number]  Chief Complaint  Patient presents with  . Foot Pain    pt is here for a possible bunion of the right foot medial side, pain has been going on for 2-3 years, pain is elevated when walking on it, pt also states that she puts pain as a 7 out of 10 on the pain scale.    39 y.o. female presents with the above complaint.  Patient presents with a complaint of painful bunion on the right foot.  Patient states been going for 2 to 3 years or progressively gotten worse.  Pain is gradual when she is standing.  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  Her pain scale 7 out of 10.  She states that she had a friend who had a bunion surgery which helped her decrease a lot of pain.  She wanted to get evaluated for the bunion and see if there is anything that could be done for this.  She states that she has flatfeet as well which could likely be the etiology of the bunion deformity.  She has not been treated for this prior.  She has tried all the over-the-counter treatment options with various bracing but has failed them all.  Review of Systems: Negative except as noted in the HPI. Denies N/V/F/Ch.  Past Medical History:  Diagnosis Date  . Genital herpes 01/2017   on culture  . UTI (urinary tract infection)   . Vitamin D deficiency 2013    Current Outpatient Medications:  .  Norethindrone-Ethinyl Estradiol-Fe Biphas (LO LOESTRIN FE) 1 MG-10 MCG / 10 MCG tablet, Take 1 tablet by mouth daily., Disp: 3 Package, Rfl: 3 .  valACYclovir (VALTREX) 500 MG tablet, Take one BID x 3-5 days prn outbreak, Disp: 30 tablet, Rfl: 1  Social History   Tobacco Use  Smoking Status Current Some Day Smoker  . Years: 10.00  . Types: Cigarettes  Smokeless Tobacco Never Used  Tobacco Comment   occasionally smokes 2-3 cigarettes    No Known Allergies Objective:  There were no vitals filed for this visit. There is no  height or weight on file to calculate BMI. Constitutional Well developed. Well nourished.  Vascular Dorsalis pedis pulses palpable bilaterally. Posterior tibial pulses palpable bilaterally. Capillary refill normal to all digits.  No cyanosis or clubbing noted. Pedal hair growth normal.  Neurologic Normal speech. Oriented to person, place, and time. Epicritic sensation to light touch grossly present bilaterally.  Dermatologic Nails well groomed and normal in appearance. No open wounds. No skin lesions.  Orthopedic: Normal joint ROM without pain or crepitus bilaterally. Hallux abductovalgus deformity present tracking not track bound.  No intra-articular first metatarsal MPJ joint pain.  Diminished range of motion mildly at the first metatarsophalangeal joint. Right 1st MPJ diminished range of motion  Right 1st TMT without gross hypermobility. Lesser digital contractures absent bilaterally.   Radiographs: Taken and reviewed. Hallux abductovalgus deformity present. Metatarsal parabola normal. 3 views of skeletally mature adult foot right: There is an increase in intermetatarsal angle increase in hallux valgus angles the sesamoid position is 5 out of 7.  Hypertrophy of the medial eminence noted no obliquity of the first tarsometatarsal joint noted.  No other bony abnormalities.  There is decreasing calcaneal inclination angle increase in talar declination angle.  There is anterior break in the cyma line.  Mild ossicle medial aspect of the navicular likely in the  posterior tibial tendon  Assessment:   1. Hav (hallux abducto valgus), right   2. Pes planus of both feet    Plan:  Patient was evaluated and treated and all questions answered.  Hallux abductovalgus deformity,  I discussed with the patient the etiology of hallux abductor valgus deformity and various treatment options associated with it.  Given that patient has failed all conservative therapy I believe patient will be an ideal  surgical candidate for correction of the right hallux valgus.  However due to scheduling issue patient would like to make another appointment to discuss further surgical management in next month and then schedule the surgery afterwards.  In the meantime given that patient has pes planus deformity bilaterally semiflexible in nature I believe she will benefit from custom-made orthotics to help control the hindfoot motion as well as support the arches of the foot and prevent the bunion from coming back after surgery as well as slow the progression on the contralateral limb.  Patient states understanding will obtain orthotics  Pes planus semiflexible -I explained to the patient the etiology of pes planus deformity with this relationship on the bunion deformity bilaterally.  I believe patient will benefit from custom-made orthotics.  Patient will obtain custom-made orthotics.  She will be scheduled to see Liliane Channel for casting and molding of orthotics.  No follow-ups on file.

## 2019-12-10 ENCOUNTER — Ambulatory Visit (INDEPENDENT_AMBULATORY_CARE_PROVIDER_SITE_OTHER): Payer: Medicaid Other | Admitting: Orthotics

## 2019-12-10 ENCOUNTER — Other Ambulatory Visit: Payer: Self-pay

## 2019-12-10 DIAGNOSIS — M2141 Flat foot [pes planus] (acquired), right foot: Secondary | ICD-10-CM

## 2019-12-10 DIAGNOSIS — M2142 Flat foot [pes planus] (acquired), left foot: Secondary | ICD-10-CM

## 2019-12-10 DIAGNOSIS — M2011 Hallux valgus (acquired), right foot: Secondary | ICD-10-CM | POA: Diagnosis not present

## 2019-12-10 NOTE — Progress Notes (Signed)
Patient seen today for f/o to address pain under 1st mpj secondary to HAV right. Plan on full contact CMFO with k-wedge cut out under 1stp mpj R.

## 2019-12-23 ENCOUNTER — Telehealth: Payer: Self-pay

## 2019-12-23 NOTE — Telephone Encounter (Signed)
Patient is in need of birth control refill until next AE 02/12/20. [facility] [phone]

## 2019-12-24 MED ORDER — LO LOESTRIN FE 1 MG-10 MCG / 10 MCG PO TABS: 1.0000 | ORAL_TABLET | Freq: Every day | ORAL | 0 refills | Status: DC

## 2019-12-24 NOTE — Telephone Encounter (Signed)
LMVM to notify refill sent to pharmacy.

## 2020-01-07 ENCOUNTER — Other Ambulatory Visit (INDEPENDENT_AMBULATORY_CARE_PROVIDER_SITE_OTHER): Payer: Medicaid Other | Admitting: Orthotics

## 2020-01-07 ENCOUNTER — Other Ambulatory Visit: Payer: Self-pay

## 2020-02-16 NOTE — Progress Notes (Signed)
Gynecology Annual Exam  PCP: [facility], [facility]  Chief Complaint:  Chief Complaint  Patient presents with  . Gynecologic Exam    no concerns    History of Present Illness: Cassie Wade is a 39 year old Asian female, G3 P2012, who presents for her annual exam. Her menses are usually absent on the Lo Loestrin, but she does report having a very light menses every 2-3 months, that lasts 2 days.  She has some back pain with her menses but does not require medication. 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 She had a typical presentation for HSV and the culture was also positive for HSV II in June 2018. She has not had an outbreak since then.  Since her last gyn exam 02/14/2019, she has had no significant changes in her health. Has not received the Covid vaccine She is not currently sexually active. Would like to continue Lo Loestrin however.  Her most recent pap smear 02/14/2019 was obtained  and was NIL/ negative HRHPV. Mammogram is not applicable.  There is no family history of breast cancer.  There is no family history of ovarian cancer.  The patient does do monthly self breast exams.  The patient does smoke occasionally (twice a month x 2-3 cigarettes). Is not smoking on a daily basis. The patient does drink occasionally, usually once or twice /month..  The patient does not use illegal drugs.  The patient is active and walks for exercise, and plays with children The patient does get adequate calcium in her diet.  She had a  cholesterol screen in 2013 that was normal.    Review of Systems: Review of Systems  Constitutional: Negative for chills, fever and weight loss.  HENT: Negative for congestion, sinus pain and sore throat.   Eyes: Negative for blurred vision and pain.  Respiratory: Negative for hemoptysis, shortness of breath and wheezing.   Cardiovascular: Negative for chest pain, palpitations and leg swelling.  Gastrointestinal:  Negative for abdominal pain, blood in stool, diarrhea, heartburn, nausea and vomiting.  Genitourinary: Negative for dysuria, frequency, hematuria and urgency.       Positive for irregular, light menses  Musculoskeletal: Negative for back pain, joint pain and myalgias.  Skin: Negative for itching and rash.  Neurological: Negative for dizziness, tingling and headaches.  Endo/Heme/Allergies: Positive for environmental allergies (with sneezing). Negative for polydipsia. Does not bruise/bleed easily.       Negative for hirsutism   Psychiatric/Behavioral: Negative for depression. The patient is not nervous/anxious and does not have insomnia.     Past Medical History:  Past Medical History:  Diagnosis Date  . Genital herpes 01/2017   on culture  . UTI (urinary tract infection)   . Vitamin D deficiency 2013    Past Surgical History:  Past Surgical History:  Procedure Laterality Date  . NO PAST SURGERIES       Family History:  Family History  Problem Relation Age of Onset  . Diabetes Maternal Grandfather   . Hypertension Maternal Grandfather   . Diabetes Maternal Great-grandmother   . Hyperlipidemia Maternal Great-grandmother     Social History:  Social History   Socioeconomic History  . Marital status: Single    Spouse name: Not on file  . Number of children: 2  . Years of education: Not on file  . Highest education level: Not on file  Occupational History  . Occupation: Sales support/purchasing  Tobacco Use  . Smoking status:  Current Some Day Smoker    Years: 10.00    Types: Cigarettes  . Smokeless tobacco: Never Used  . Tobacco comment: occasionally smokes 2-3 cigarettes maybe twice a month  Vaping Use  . Vaping Use: Never used  Substance and Sexual Activity  . Alcohol use: Yes  . Drug use: No  . Sexual activity: Not Currently    Birth control/protection: Pill  Other Topics Concern  . Not on file  Social History Narrative  . Not on file   Social  Determinants of Health   Financial Resource Strain:   . Difficulty of Paying Living Expenses:   Food Insecurity:   . Worried About Programme researcher, broadcasting/film/video in the Last Year:   . Barista in the Last Year:   Transportation Needs:   . Freight forwarder (Medical):   Marland Kitchen Lack of Transportation (Non-Medical):   Physical Activity:   . Days of Exercise per Week:   . Minutes of Exercise per Session:   Stress:   . Feeling of Stress :   Social Connections:   . Frequency of Communication with Friends and Family:   . Frequency of Social Gatherings with Friends and Family:   . Attends Religious Services:   . Active Member of Clubs or Organizations:   . Attends Banker Meetings:   Marland Kitchen Marital Status:   Intimate Partner Violence:   . Fear of Current or Ex-Partner:   . Emotionally Abused:   Marland Kitchen Physically Abused:   . Sexually Abused:     Allergies:  No Known Allergies  Medications: Claritin prn, Women's One A Day vitamin   Physical Exam Vitals: BP 110/80   Ht 5' (1.524 m)   Wt 139 lb (63 kg)   BMI 27.15 kg/m   General: pleasant Asian female in NAD, well developed, well nourished HEENT: normocephalic, anicteric Thyroid: no enlargement, no palpable nodules Pulmonary: No increased work of breathing, CTAB Cardiovascular: RRR without murmur Breast: Breast symmetrical, no tenderness, no palpable nodules or masses, no skin or nipple retraction present, no nipple discharge.  No axillary, infraclavicular, or supraclavicular lymphadenopathy. Abdomen: soft, non-tender, non-distended.  Umbilicus without lesions.  No hepatomegaly or masses palpable. No evidence of hernia  Genitourinary:  External: Normal external female genitalia.  Normal urethral meatus, normal Bartholin's and Skene's glands.    Vagina: Normal vaginal mucosa, no evidence of prolapse, off white homogenous discharge.    Cervix: Grossly normal in appearance, friable with Pap at 6 o'clock, NT  Uterus: AV, NSSC,  mobile, NT  Adnexa: no adnexal masses, NT  Rectal: deferred  Lymphatic: no evidence of inguinal lymphadenopathy Extremities: no edema, erythema, or tenderness Neurologic: Grossly intact Psychiatric: mood appropriate, affect full     Assessment: 39 y.o. [id number] well woman exam Contraceptive management Tobacco use  Plan:   1) Recommend monthly SBE. Begin annual mammograms at age 38  2) Cervical cancer screening: Pap done/ Screening for STI done  3) Contraception - Refill LoLoestrin Fe #3/ RF x3. Advised that would not prescribe OCPs if she continues to smoke due to increased risk of thromboembolic disease. Patient states that she would stop smoking and refill sent to pharmacy  4) Routine healthcare maintenance including cholesterol, diabetes screening done  6) RTO in 1 year for annual.  Farrel Conners, CNM

## 2020-02-17 ENCOUNTER — Other Ambulatory Visit (HOSPITAL_COMMUNITY)
Admission: RE | Admit: 2020-02-17 | Discharge: 2020-02-17 | Disposition: A | Discharge status: Home / Self Care | Payer: Medicaid Other | Source: Ambulatory Visit | Attending: Certified Nurse Midwife | Admitting: Certified Nurse Midwife

## 2020-02-17 ENCOUNTER — Other Ambulatory Visit: Payer: Self-pay

## 2020-02-17 ENCOUNTER — Ambulatory Visit (INDEPENDENT_AMBULATORY_CARE_PROVIDER_SITE_OTHER): Payer: Medicaid Other | Admitting: Certified Nurse Midwife

## 2020-02-17 ENCOUNTER — Encounter: Payer: Self-pay | Admitting: Certified Nurse Midwife

## 2020-02-17 VITALS — BP 110/80 | Ht 60.0 in | Wt 139.0 lb

## 2020-02-17 DIAGNOSIS — Z13220 Encounter for screening for lipoid disorders: Secondary | ICD-10-CM

## 2020-02-17 DIAGNOSIS — Z113 Encounter for screening for infections with a predominantly sexual mode of transmission: Secondary | ICD-10-CM | POA: Insufficient documentation

## 2020-02-17 DIAGNOSIS — Z01419 Encounter for gynecological examination (general) (routine) without abnormal findings: Secondary | ICD-10-CM | POA: Diagnosis not present

## 2020-02-17 DIAGNOSIS — Z124 Encounter for screening for malignant neoplasm of cervix: Secondary | ICD-10-CM

## 2020-02-17 DIAGNOSIS — Z0000 Encounter for general adult medical examination without abnormal findings: Secondary | ICD-10-CM | POA: Diagnosis not present

## 2020-02-17 DIAGNOSIS — Z131 Encounter for screening for diabetes mellitus: Secondary | ICD-10-CM

## 2020-02-17 MED ORDER — LO LOESTRIN FE 1 MG-10 MCG / 10 MCG PO TABS: 1.0000 | ORAL_TABLET | Freq: Every day | ORAL | 3 refills | Status: DC

## 2020-02-18 ENCOUNTER — Encounter: Payer: Self-pay | Admitting: Certified Nurse Midwife

## 2020-02-18 LAB — LIPID PANEL
Chol/HDL Ratio: 2.9 ratio (ref 0.0–4.4)
Cholesterol, Total: 153 mg/dL (ref 100–199)
HDL: 53 mg/dL (ref >39)
LDL Chol Calc (NIH): 85 mg/dL (ref 0–99)
Triglycerides: 75 mg/dL (ref 0–149)
VLDL Cholesterol Cal: 15 mg/dL (ref 5–40)

## 2020-02-18 LAB — HEMOGLOBIN A1C
Est. average glucose Bld gHb Est-mCnc: 100 mg/dL
Hgb A1c MFr Bld: 5.1 % (ref 4.8–5.6)

## 2020-02-19 ENCOUNTER — Encounter: Payer: Self-pay | Admitting: Certified Nurse Midwife

## 2020-02-19 LAB — CYTOLOGY - PAP
Chlamydia: NEGATIVE
Comment: NEGATIVE
Comment: NEGATIVE
Comment: NORMAL
Diagnosis: NEGATIVE
High risk HPV: NEGATIVE
Neisseria Gonorrhea: NEGATIVE

## 2020-04-19 ENCOUNTER — Other Ambulatory Visit: Payer: Self-pay | Admitting: Family Medicine

## 2020-04-19 DIAGNOSIS — M7989 Other specified soft tissue disorders: Secondary | ICD-10-CM

## 2020-04-19 DIAGNOSIS — Z1231 Encounter for screening mammogram for malignant neoplasm of breast: Secondary | ICD-10-CM

## 2020-05-03 ENCOUNTER — Ambulatory Visit
Admission: RE | Admit: 2020-05-03 | Discharge: 2020-05-03 | Disposition: A | Discharge status: Home / Self Care | Payer: Medicaid Other | Source: Ambulatory Visit | Attending: Family Medicine | Admitting: Family Medicine

## 2020-05-03 ENCOUNTER — Other Ambulatory Visit: Payer: Self-pay

## 2020-05-03 DIAGNOSIS — M7989 Other specified soft tissue disorders: Secondary | ICD-10-CM | POA: Diagnosis present

## 2020-05-03 DIAGNOSIS — R590 Localized enlarged lymph nodes: Secondary | ICD-10-CM | POA: Insufficient documentation

## 2020-05-03 DIAGNOSIS — Z1231 Encounter for screening mammogram for malignant neoplasm of breast: Secondary | ICD-10-CM | POA: Diagnosis present

## 2020-05-04 ENCOUNTER — Other Ambulatory Visit: Payer: Self-pay | Admitting: Family Medicine

## 2020-05-05 ENCOUNTER — Other Ambulatory Visit: Payer: Self-pay | Admitting: Family Medicine

## 2020-05-05 DIAGNOSIS — R928 Other abnormal and inconclusive findings on diagnostic imaging of breast: Secondary | ICD-10-CM

## 2020-05-13 ENCOUNTER — Ambulatory Visit
Admission: RE | Admit: 2020-05-13 | Discharge: 2020-05-13 | Disposition: A | Discharge status: Home / Self Care | Payer: Medicaid Other | Source: Ambulatory Visit | Attending: Family Medicine | Admitting: Family Medicine

## 2020-05-13 ENCOUNTER — Other Ambulatory Visit: Payer: Self-pay

## 2020-05-13 DIAGNOSIS — R928 Other abnormal and inconclusive findings on diagnostic imaging of breast: Secondary | ICD-10-CM

## 2020-05-13 HISTORY — PX: BREAST BIOPSY: SHX20

## 2020-05-17 ENCOUNTER — Encounter: Payer: Self-pay | Admitting: Family Medicine

## 2020-05-17 LAB — SURGICAL PATHOLOGY

## 2021-01-07 ENCOUNTER — Telehealth: Payer: Self-pay

## 2021-01-07 MED ORDER — LO LOESTRIN FE 1 MG-10 MCG / 10 MCG PO TABS: 1.0000 | ORAL_TABLET | Freq: Every day | ORAL | 1 refills | Status: DC

## 2021-01-07 NOTE — Telephone Encounter (Signed)
1 pack w/1 rf sent. Pt aware.

## 2021-01-07 NOTE — Telephone Encounter (Signed)
Patient scheduled w/[facility] for [facility] 02/17/21. Previous rx from [facility] unable to be refilled d/t provider no longer in practice. Please refill OCP to get to AE apt. She will need 2 packs. Her last pill is tomorrow. ME#[phone]

## 2021-02-03 ENCOUNTER — Other Ambulatory Visit: Payer: Self-pay

## 2021-02-03 MED ORDER — LO LOESTRIN FE 1 MG-10 MCG / 10 MCG PO TABS: 1.0000 | ORAL_TABLET | Freq: Every day | ORAL | 0 refills | Status: DC

## 2021-02-10 IMAGING — MG MM BREAST LOCALIZATION CLIP
6 series · 6 of 18 positions shown · non-contrast
Comparison: Previous exam(s).

CLINICAL DATA: Evaluate biopsy marker

EXAM:
DIAGNOSTIC RIGHT MAMMOGRAM POST ULTRASOUND BIOPSY

[R MLO synth-2D]
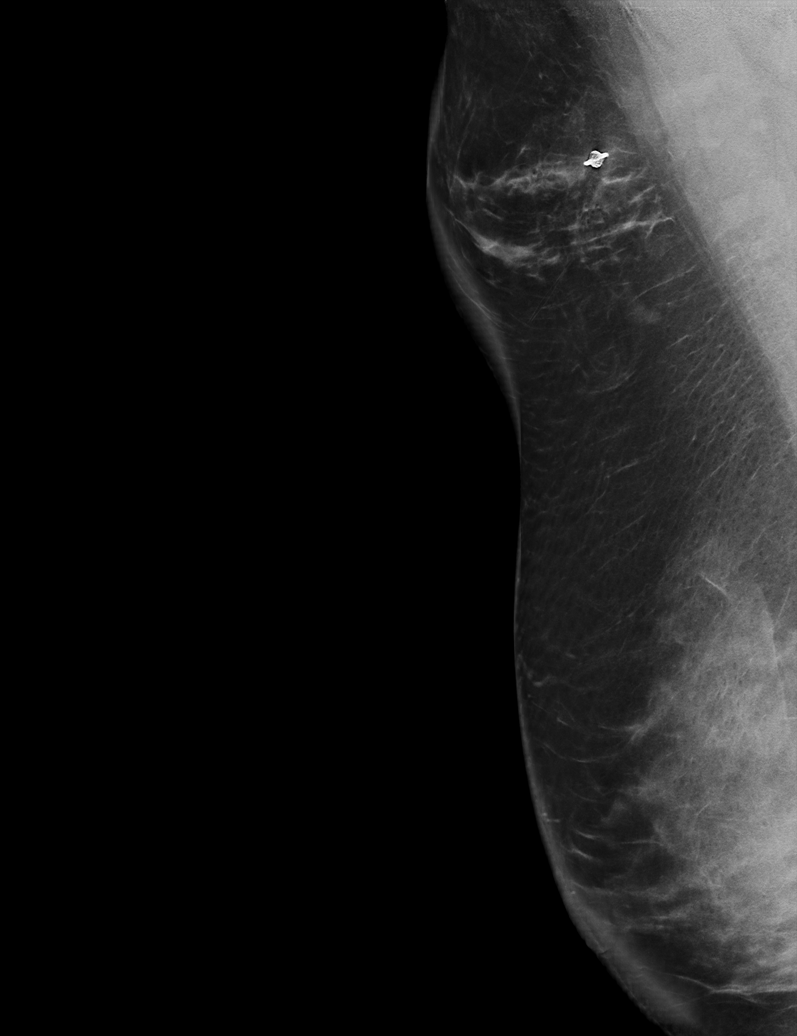

[R ML synth-2D]
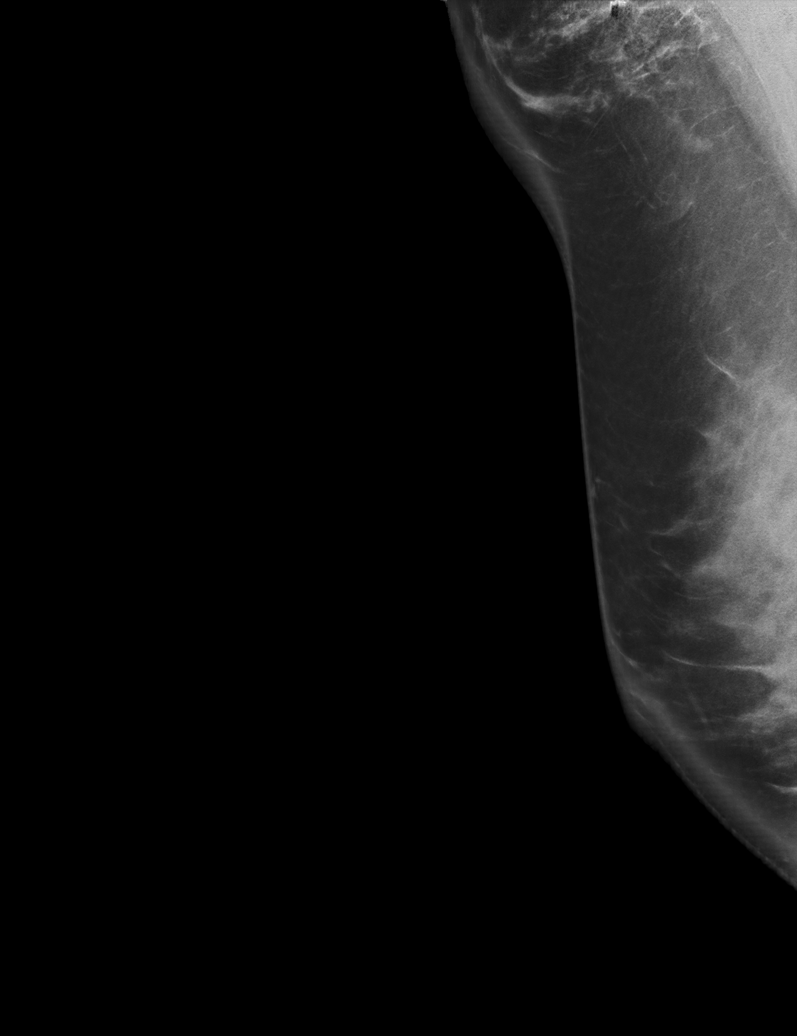

[R CC synth-2D]
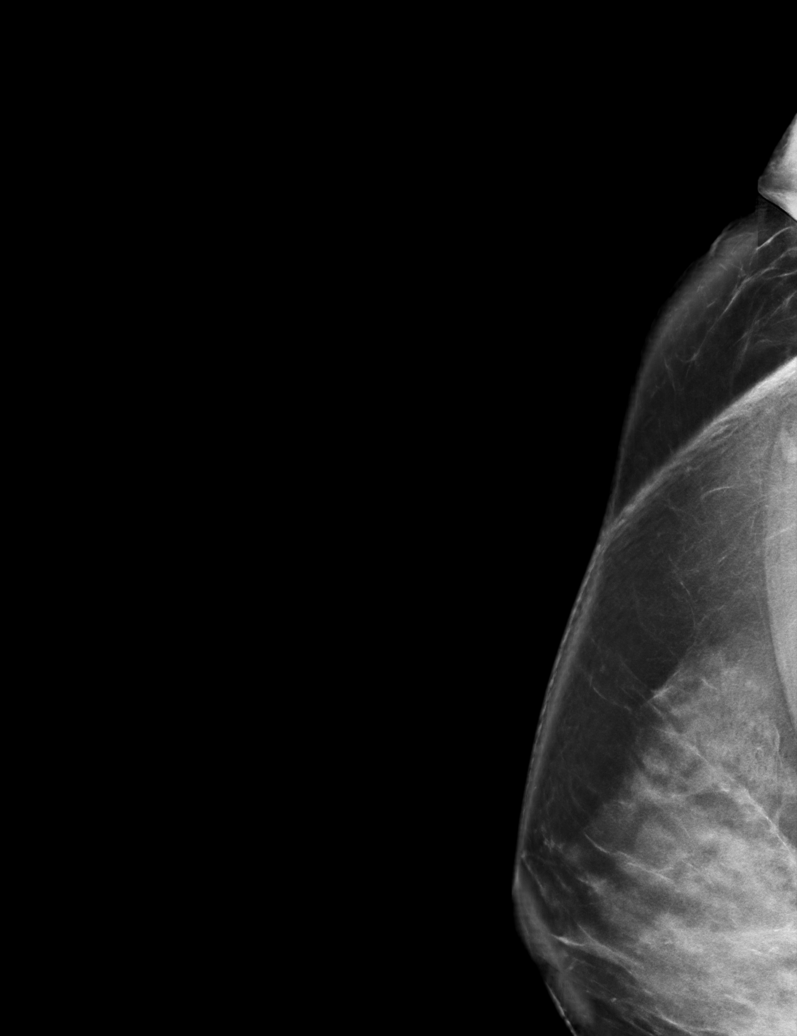

[R CC tomo · tomo slice 51/101.0]
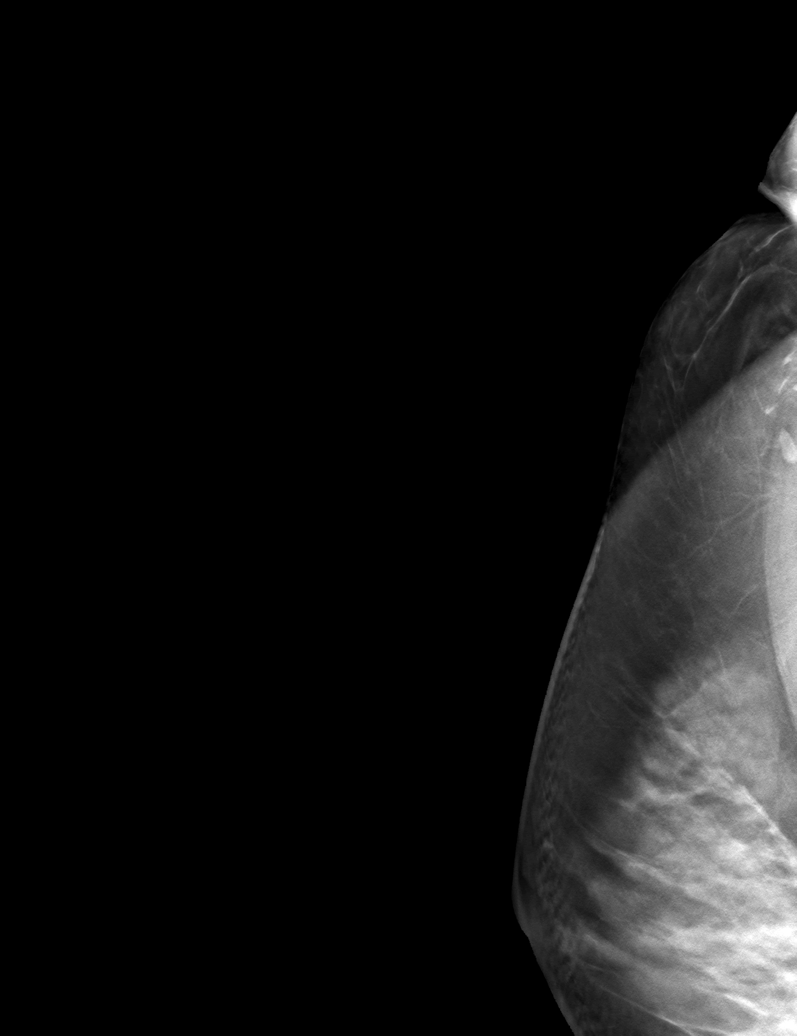

[R ML tomo · tomo slice 55/110.0]
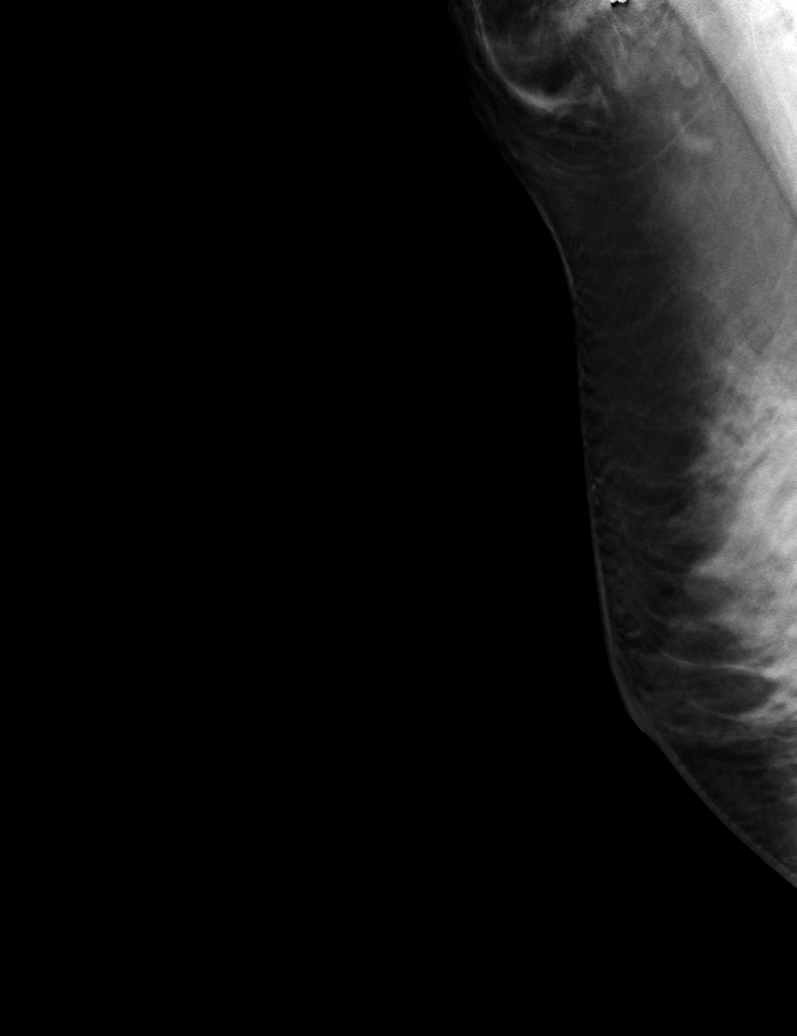

[R MLO tomo · tomo slice 55/108.0]
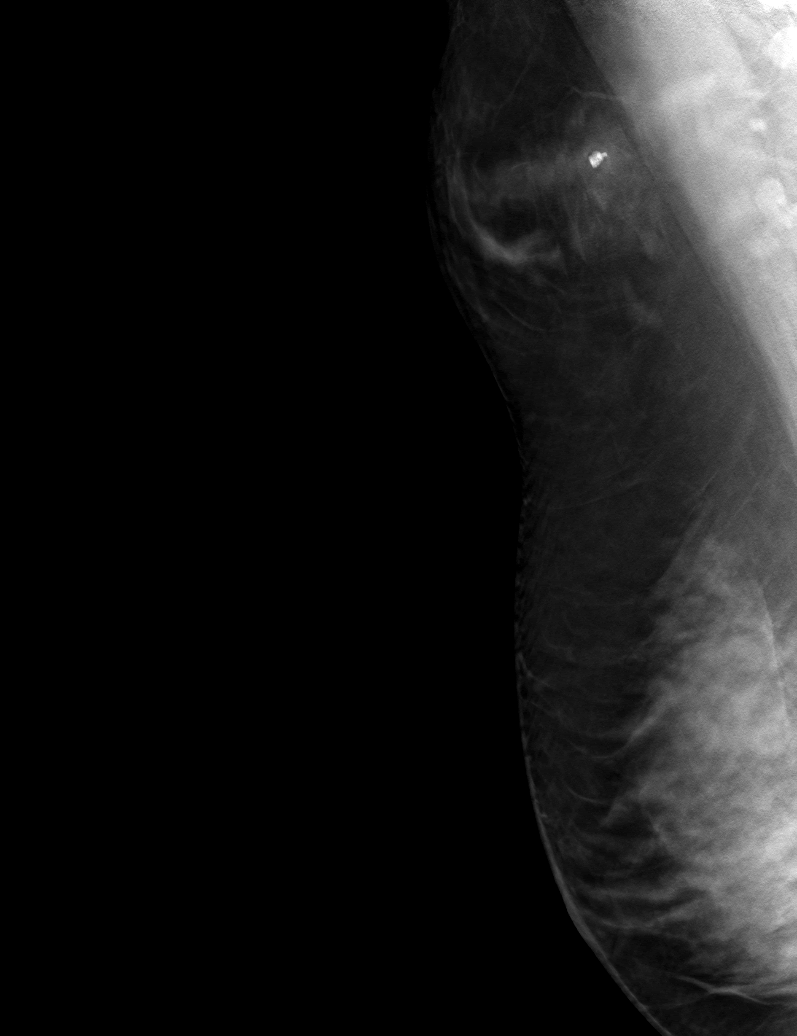

[6 of 18 positions shown; findings below may reference images not displayed]

FINDINGS: Mammographic images were obtained following ultrasound guided biopsy
of a right axillary lymph node. The biopsy marking clip is in
expected position at the site of biopsy.
IMPRESSION: Appropriate positioning of the biopsy marking clip at the site of
biopsy in the biopsied lymph node.

Final Assessment: Post Procedure Mammograms for Marker Placement

## 2021-02-15 NOTE — Progress Notes (Signed)
PCP:  [facility], [facility]   Chief Complaint  Patient presents with   Annual Exam    Wants to Change [facility]    HPI:      [name] is a 40 y.o. [id number] whose LMP was Patient's last menstrual period was 02/13/2021., presents today for her annual examination.  Her menses were absent on Lo Loestrin last yr with occas light bleeding. Now having monthly menses, lasting 6-7 days, mod flow, no BTB, mild dysmen/LBP, improved with NSAIDs. Would like to change OCPs.   Sex activity: currently sexually active--contraception OCPs; no pain/bleeding.   Last Pap:  02/17/20  Results were: no abnormalities /neg HPV DNA  Hx of STDs: HSV  Last mammogram: 05/03/20 Results were: Cat 4 for RT axillary LAN after covid shot. Had neg bx. Due for screening mammo age 61 There is no FH of breast cancer. There is no FH of ovarian cancer. The patient does do self-breast exams.  Tobacco use: social cigs Alcohol use: none No drug use.  Exercise: min active  She does get adequate calcium and Vitamin D in her diet.   Past Medical History:  Diagnosis Date   Genital herpes 01/2017   on culture   UTI (urinary tract infection)    Vitamin D deficiency 2013    Past Surgical History:  Procedure Laterality Date   BREAST BIOPSY Right 05/13/2020   u/s biopsy/ path pending   NO PAST SURGERIES      Family History  Problem Relation Age of Onset   Diabetes Maternal Grandfather    Hypertension Maternal Grandfather    Diabetes Maternal Great-grandmother    Hyperlipidemia Maternal Great-grandmother    Breast cancer Neg Hx     Social History   Socioeconomic History   Marital status: Single    Spouse name: Not on file   Number of children: 2   Years of education: Not on file   Highest education level: Not on file  Occupational History   Occupation: Sales support/purchasing  Tobacco Use   Smoking status: Some Days    Years: 10.00    Pack years: 0.00    Types: Cigarettes   Smokeless  tobacco: Never   Tobacco comments:    occasionally smokes 2-3 cigarettes maybe twice a month  Vaping Use   Vaping Use: Never used  Substance and Sexual Activity   Alcohol use: Yes   Drug use: No   Sexual activity: Not Currently    Birth control/protection: Pill  Other Topics Concern   Not on file  Social History Narrative   Not on file   Social Determinants of Health   Financial Resource Strain: Not on file  Food Insecurity: Not on file  Transportation Needs: Not on file  Physical Activity: Not on file  Stress: Not on file  Social Connections: Not on file  Intimate Partner Violence: Not on file     Current Outpatient Medications:    drospirenone-ethinyl estradiol (YAZ) 3-0.02 MG tablet, Take 1 tablet by mouth daily., Disp: 84 tablet, Rfl: 3     ROS:  Review of Systems  Constitutional:  Negative for fatigue, fever and unexpected weight change.  Respiratory:  Negative for cough, shortness of breath and wheezing.   Cardiovascular:  Negative for chest pain, palpitations and leg swelling.  Gastrointestinal:  Negative for blood in stool, constipation, diarrhea, nausea and vomiting.  Endocrine: Negative for cold intolerance, heat intolerance and polyuria.  Genitourinary:  Negative for dyspareunia, dysuria, flank pain, frequency, genital  sores, hematuria, menstrual problem, pelvic pain, urgency, vaginal bleeding, vaginal discharge and vaginal pain.  Musculoskeletal:  Negative for back pain, joint swelling and myalgias.  Skin:  Negative for rash.  Neurological:  Negative for dizziness, syncope, light-headedness, numbness and headaches.  Hematological:  Negative for adenopathy.  Psychiatric/Behavioral:  Negative for agitation, confusion, sleep disturbance and suicidal ideas. The patient is not nervous/anxious.   BREAST: No symptoms   Objective: BP 110/70   Ht 5' (1.524 m)   Wt 136 lb (61.7 kg)   LMP 02/13/2021   BMI 26.56 kg/m    Physical Exam Constitutional:       Appearance: She is well-developed.  Genitourinary:     Vulva normal.     Right Labia: No rash, tenderness or lesions.    Left Labia: No tenderness, lesions or rash.    No vaginal discharge, erythema or tenderness.      Right Adnexa: not tender and no mass present.    Left Adnexa: not tender and no mass present.    No cervical friability or polyp.     Uterus is not enlarged or tender.  Breasts:    Right: No mass, nipple discharge, skin change or tenderness.     Left: No mass, nipple discharge, skin change or tenderness.  Neck:     Thyroid: No thyromegaly.  Cardiovascular:     Rate and Rhythm: Normal rate and regular rhythm.     Heart sounds: Normal heart sounds. No murmur heard. Pulmonary:     Effort: Pulmonary effort is normal.     Breath sounds: Normal breath sounds.  Abdominal:     Palpations: Abdomen is soft.     Tenderness: There is no abdominal tenderness. There is no guarding or rebound.  Musculoskeletal:        General: Normal range of motion.     Cervical back: Normal range of motion.  Lymphadenopathy:     Cervical: No cervical adenopathy.  Neurological:     General: No focal deficit present.     Mental Status: She is alert and oriented to person, place, and time.     Cranial Nerves: No cranial nerve deficit.  Skin:    General: Skin is warm and dry.  Psychiatric:        Mood and Affect: Mood normal.        Behavior: Behavior normal.        Thought Content: Thought content normal.        Judgment: Judgment normal.  Vitals reviewed.    Assessment/Plan: Encounter for annual routine gynecological examination  Encounter for surveillance of contraceptive pills - Plan: drospirenone-ethinyl estradiol (YAZ) 3-0.02 MG tablet; pt's periods longer with Lo Loestrin this yr. Will change to yaz to see if sx improve. Rx eRxd. F/u prn.   Meds ordered this encounter  Medications   drospirenone-ethinyl estradiol (YAZ) 3-0.02 MG tablet    Sig: Take 1 tablet by mouth daily.     Dispense:  84 tablet    Refill:  3    Order Specific Question:   Supervising Provider    Answer:   Nadara Mustard [[id number]]             GYN counsel adequate intake of calcium and vitamin D, diet and exercise     F/U  Return in about 1 year (around 02/17/2022).  Cassie Pacha B. Naamah Boggess, PA-C 02/17/2021 10:36 AM

## 2021-02-17 ENCOUNTER — Encounter: Payer: Self-pay | Admitting: Obstetrics and Gynecology

## 2021-02-17 ENCOUNTER — Ambulatory Visit (INDEPENDENT_AMBULATORY_CARE_PROVIDER_SITE_OTHER): Payer: Medicaid Other | Admitting: Obstetrics and Gynecology

## 2021-02-17 ENCOUNTER — Other Ambulatory Visit: Payer: Self-pay

## 2021-02-17 VITALS — BP 110/70 | Ht 60.0 in | Wt 136.0 lb

## 2021-02-17 DIAGNOSIS — Z01419 Encounter for gynecological examination (general) (routine) without abnormal findings: Secondary | ICD-10-CM

## 2021-02-17 DIAGNOSIS — Z3041 Encounter for surveillance of contraceptive pills: Secondary | ICD-10-CM | POA: Diagnosis not present

## 2021-02-17 MED ORDER — DROSPIRENONE-ETHINYL ESTRADIOL 3-0.02 MG PO TABS: 1.0000 | ORAL_TABLET | Freq: Every day | ORAL | 3 refills | Status: DC

## 2021-02-17 NOTE — Patient Instructions (Signed)
I value your feedback and you entrusting us with your care. If you get a Cassie Wade patient survey, I would appreciate you taking the time to let us know about your experience today. Thank you! ? ? ?

## 2021-11-16 ENCOUNTER — Telehealth: Payer: Self-pay

## 2021-11-16 ENCOUNTER — Other Ambulatory Visit: Payer: Self-pay | Admitting: Obstetrics and Gynecology

## 2021-11-16 NOTE — Telephone Encounter (Signed)
Pt calling; states needs refill of bc but annual isn't due until June.  [phone]  Called pharm; spoke c Christiane Ha; pt has picked up her last pack which would get her to June; pt aware. ?

## 2021-12-11 ENCOUNTER — Other Ambulatory Visit: Payer: Self-pay | Admitting: Obstetrics and Gynecology

## 2022-02-03 ENCOUNTER — Other Ambulatory Visit: Payer: Self-pay

## 2022-02-03 DIAGNOSIS — Z3041 Encounter for surveillance of contraceptive pills: Secondary | ICD-10-CM

## 2022-02-03 MED ORDER — DROSPIRENONE-ETHINYL ESTRADIOL 3-0.02 MG PO TABS: 1.0000 | ORAL_TABLET | Freq: Every day | ORAL | 0 refills | Status: DC

## 2022-02-03 NOTE — Telephone Encounter (Signed)
Pt needed refill on her medication tolast to annual . Sent in to her pharmacy

## 2022-02-05 ENCOUNTER — Other Ambulatory Visit: Payer: Self-pay | Admitting: Obstetrics and Gynecology

## 2022-02-05 DIAGNOSIS — Z3041 Encounter for surveillance of contraceptive pills: Secondary | ICD-10-CM

## 2022-03-29 NOTE — Progress Notes (Unsigned)
PCP:  [facility], [facility]   No chief complaint on file.   HPI:      Cassie Wade is a 41 y.o. [id number] whose LMP was No LMP recorded., presents today for her annual examination.  Her menses were absent on Lo Loestrin last yr with occas light bleeding. Now having monthly menses, lasting 6-7 days, mod flow, no BTB, mild dysmen/LBP, improved with NSAIDs. Would like to change OCPs.   Sex activity: currently sexually active--contraception OCPs; no pain/bleeding.   Last Pap:  02/17/20  Results were: no abnormalities /neg HPV DNA  Hx of STDs: HSV  Last mammogram: 05/03/20 Results were: Cat 4 for RT axillary LAN after covid shot. Had neg bx. Due for screening mammo age 46 There is no FH of breast cancer. There is no FH of ovarian cancer. The patient does do self-breast exams.  Tobacco use: social cigs Alcohol use: none No drug use.  Exercise: min active  She does get adequate calcium and Vitamin D in her diet.   Past Medical History:  Diagnosis Date   Genital herpes 01/2017   on culture   UTI (urinary tract infection)    Vitamin D deficiency 2013    Past Surgical History:  Procedure Laterality Date   BREAST BIOPSY Right 05/13/2020   u/s biopsy/ path pending   NO PAST SURGERIES      Family History  Problem Relation Age of Onset   Diabetes Maternal Grandfather    Hypertension Maternal Grandfather    Diabetes Maternal Great-grandmother    Hyperlipidemia Maternal Great-grandmother    Breast cancer Neg Hx     Social History   Socioeconomic History   Marital status: Single    Spouse name: Not on file   Number of children: 2   Years of education: Not on file   Highest education level: Not on file  Occupational History   Occupation: Sales support/purchasing  Tobacco Use   Smoking status: Some Days    Years: 10.00    Types: Cigarettes   Smokeless tobacco: Never   Tobacco comments:    occasionally smokes 2-3 cigarettes maybe twice a month   Vaping Use   Vaping Use: Never used  Substance and Sexual Activity   Alcohol use: Yes   Drug use: No   Sexual activity: Not Currently    Birth control/protection: Pill  Other Topics Concern   Not on file  Social History Narrative   Not on file   Social Determinants of Health   Financial Resource Strain: Not on file  Food Insecurity: Not on file  Transportation Needs: Not on file  Physical Activity: Unknown (12/28/2017)   Exercise Vital Sign    Days of Exercise per Week: 0 days    Minutes of Exercise per Session: Not on file  Stress: No Stress Concern Present (12/28/2017)   [employer] of Occupational Health - Occupational Stress Questionnaire    Feeling of Stress : Not at all  Social Connections: Not on file  Intimate Partner Violence: Not on file     Current Outpatient Medications:    drospirenone-ethinyl estradiol (YAZ) 3-0.02 MG tablet, Take 1 tablet by mouth daily., Disp: 84 tablet, Rfl: 0     ROS:  Review of Systems  Constitutional:  Negative for fatigue, fever and unexpected weight change.  Respiratory:  Negative for cough, shortness of breath and wheezing.   Cardiovascular:  Negative for chest pain, palpitations and leg swelling.  Gastrointestinal:  Negative for blood in stool,  constipation, diarrhea, nausea and vomiting.  Endocrine: Negative for cold intolerance, heat intolerance and polyuria.  Genitourinary:  Negative for dyspareunia, dysuria, flank pain, frequency, genital sores, hematuria, menstrual problem, pelvic pain, urgency, vaginal bleeding, vaginal discharge and vaginal pain.  Musculoskeletal:  Negative for back pain, joint swelling and myalgias.  Skin:  Negative for rash.  Neurological:  Negative for dizziness, syncope, light-headedness, numbness and headaches.  Hematological:  Negative for adenopathy.  Psychiatric/Behavioral:  Negative for agitation, confusion, sleep disturbance and suicidal ideas. The patient is not nervous/anxious.     BREAST: No symptoms   Objective: There were no vitals taken for this visit.   Physical Exam Constitutional:      Appearance: She is well-developed.  Genitourinary:     Vulva normal.     Right Labia: No rash, tenderness or lesions.    Left Labia: No tenderness, lesions or rash.    No vaginal discharge, erythema or tenderness.      Right Adnexa: not tender and no mass present.    Left Adnexa: not tender and no mass present.    No cervical friability or polyp.     Uterus is not enlarged or tender.  Breasts:    Right: No mass, nipple discharge, skin change or tenderness.     Left: No mass, nipple discharge, skin change or tenderness.  Neck:     Thyroid: No thyromegaly.  Cardiovascular:     Rate and Rhythm: Normal rate and regular rhythm.     Heart sounds: Normal heart sounds. No murmur heard. Pulmonary:     Effort: Pulmonary effort is normal.     Breath sounds: Normal breath sounds.  Abdominal:     Palpations: Abdomen is soft.     Tenderness: There is no abdominal tenderness. There is no guarding or rebound.  Musculoskeletal:        General: Normal range of motion.     Cervical back: Normal range of motion.  Lymphadenopathy:     Cervical: No cervical adenopathy.  Neurological:     General: No focal deficit present.     Mental Status: She is alert and oriented to person, place, and time.     Cranial Nerves: No cranial nerve deficit.  Skin:    General: Skin is warm and dry.  Psychiatric:        Mood and Affect: Mood normal.        Behavior: Behavior normal.        Thought Content: Thought content normal.        Judgment: Judgment normal.  Vitals reviewed.     Assessment/Plan: Encounter for annual routine gynecological examination  Encounter for surveillance of contraceptive pills - Plan: drospirenone-ethinyl estradiol (YAZ) 3-0.02 MG tablet; pt's periods longer with Lo Loestrin this yr. Will change to yaz to see if sx improve. Rx eRxd. F/u prn.   No orders of  the defined types were placed in this encounter.            GYN counsel adequate intake of calcium and vitamin D, diet and exercise     F/U  No follow-ups on file.  Kathlee Barnhardt B. Tatijana Bierly, PA-C 03/29/2022 8:01 PM

## 2022-03-30 ENCOUNTER — Encounter: Payer: Self-pay | Admitting: Obstetrics and Gynecology

## 2022-03-30 ENCOUNTER — Ambulatory Visit (INDEPENDENT_AMBULATORY_CARE_PROVIDER_SITE_OTHER): Payer: Medicaid Other | Admitting: Obstetrics and Gynecology

## 2022-03-30 VITALS — BP 112/80 | Ht 60.0 in | Wt 134.0 lb

## 2022-03-30 DIAGNOSIS — Z01419 Encounter for gynecological examination (general) (routine) without abnormal findings: Secondary | ICD-10-CM | POA: Diagnosis not present

## 2022-03-30 DIAGNOSIS — N926 Irregular menstruation, unspecified: Secondary | ICD-10-CM

## 2022-03-30 DIAGNOSIS — Z3041 Encounter for surveillance of contraceptive pills: Secondary | ICD-10-CM

## 2022-03-30 LAB — POCT URINE PREGNANCY: Preg Test, Ur: NEGATIVE

## 2022-03-30 MED ORDER — DROSPIRENONE-ETHINYL ESTRADIOL 3-0.02 MG PO TABS: 1.0000 | ORAL_TABLET | Freq: Every day | ORAL | 3 refills | Status: DC

## 2022-03-30 NOTE — Patient Instructions (Signed)
I value your feedback and you entrusting us with your care. If you get a [facility] patient survey, I would appreciate you taking the time to let us know about your experience today. Thank you! ? ? ?

## 2023-04-03 ENCOUNTER — Other Ambulatory Visit: Payer: Self-pay | Admitting: Obstetrics and Gynecology

## 2023-04-03 DIAGNOSIS — Z3041 Encounter for surveillance of contraceptive pills: Secondary | ICD-10-CM
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
## 2023-04-10 ENCOUNTER — Other Ambulatory Visit (HOSPITAL_COMMUNITY)
Admission: RE | Admit: 2023-04-10 | Discharge: 2023-04-10 | Disposition: A | Discharge status: Home / Self Care | Payer: Medicaid Other | Source: Ambulatory Visit | Attending: Obstetrics and Gynecology | Admitting: Obstetrics and Gynecology

## 2023-04-10 ENCOUNTER — Ambulatory Visit (INDEPENDENT_AMBULATORY_CARE_PROVIDER_SITE_OTHER): Payer: Medicaid Other | Admitting: Obstetrics and Gynecology

## 2023-04-10 ENCOUNTER — Encounter: Payer: Self-pay | Admitting: Obstetrics and Gynecology

## 2023-04-10 VITALS — BP 127/85 | HR 79 | Ht 60.0 in | Wt 135.3 lb

## 2023-04-10 DIAGNOSIS — Z124 Encounter for screening for malignant neoplasm of cervix: Secondary | ICD-10-CM | POA: Diagnosis present

## 2023-04-10 DIAGNOSIS — Z1151 Encounter for screening for human papillomavirus (HPV): Secondary | ICD-10-CM | POA: Diagnosis present

## 2023-04-10 DIAGNOSIS — Z01419 Encounter for gynecological examination (general) (routine) without abnormal findings: Secondary | ICD-10-CM | POA: Diagnosis not present

## 2023-04-10 DIAGNOSIS — Z3041 Encounter for surveillance of contraceptive pills: Secondary | ICD-10-CM

## 2023-04-10 MED ORDER — DROSPIRENONE-ETHINYL ESTRADIOL 3-0.02 MG PO TABS
1.0000 | ORAL_TABLET | Freq: Every day | ORAL | 3 refills | Status: DC
Start: 2023-04-10 — End: 2024-03-17

## 2023-04-10 NOTE — Patient Instructions (Signed)
I value your feedback and you entrusting us with your care. If you get a [facility] patient survey, I would appreciate you taking the time to let us know about your experience today. Thank you! ? ? ?

## 2023-04-11 LAB — CYTOLOGY - PAP: Diagnosis: NEGATIVE

## 2024-03-03 ENCOUNTER — Other Ambulatory Visit: Payer: Self-pay | Admitting: Obstetrics and Gynecology

## 2024-03-03 DIAGNOSIS — Z3041 Encounter for surveillance of contraceptive pills: Secondary | ICD-10-CM

## 2024-03-04 ENCOUNTER — Other Ambulatory Visit: Payer: Self-pay

## 2024-03-17 ENCOUNTER — Other Ambulatory Visit: Payer: Self-pay

## 2024-03-17 DIAGNOSIS — Z3041 Encounter for surveillance of contraceptive pills: Secondary | ICD-10-CM

## 2024-03-17 MED ORDER — DROSPIRENONE-ETHINYL ESTRADIOL 3-0.02 MG PO TABS
1.0000 | ORAL_TABLET | Freq: Every day | ORAL | 0 refills | Status: DC
Start: 2024-03-17 — End: 2024-04-10

## 2024-04-09 NOTE — Progress Notes (Unsigned)
 PCP:  [facility], [facility]   No chief complaint on file.   HPI:      Cassie Wade is a 43 y.o. [id number] whose LMP was No LMP recorded., presents today for her annual examination.  Her menses are monthly on yaz,  lasting 3-5 days, mod flow, no BTB, mild dysmen/LBP, improved with NSAIDs.   Sex activity: currently sexually active--contraception OCPs; no pain/bleeding.  Considering pregnancy in a couple yrs.  Last Pap:  04/10/23 Results were: no abnormalities /neg HPV DNA 2021 Hx of STDs: HSV on culture  Last mammogram: 05/03/20 Results were: Cat 4 for RT axillary LAN after covid shot. Had neg bx. Due for screening mammo age 63 There is no FH of breast cancer. There is no FH of ovarian cancer. The patient does do self-breast exams.  Tobacco use: social cigs Alcohol use: occas No drug use.  Exercise: mod active  She does get adequate calcium and Vitamin D in her diet.   Past Medical History:  Diagnosis Date   Genital herpes 01/2017   on culture   UTI (urinary tract infection)    Vitamin D deficiency 2013    Past Surgical History:  Procedure Laterality Date   BREAST BIOPSY Right 05/13/2020   u/s biopsy/ path pending    Family History  Problem Relation Age of Onset   Diabetes Maternal Grandfather    Hypertension Maternal Grandfather    Diabetes Maternal Great-grandmother    Hyperlipidemia Maternal Great-grandmother    Breast cancer Neg Hx     Social History   Socioeconomic History   Marital status: Single    Spouse name: Not on file   Number of children: 2   Years of education: Not on file   Highest education level: Not on file  Occupational History   Occupation: Sales support/purchasing  Tobacco Use   Smoking status: Some Days    Types: Cigarettes   Smokeless tobacco: Never   Tobacco comments:    occasionally smokes 2-3 cigarettes maybe twice a month  Vaping Use   Vaping status: Never Used  Substance and Sexual Activity   Alcohol  use: Yes   Drug use: No   Sexual activity: Yes    Birth control/protection: Pill  Other Topics Concern   Not on file  Social History Narrative   Not on file   Social Drivers of Health   Financial Resource Strain: Not on file  Food Insecurity: Not on file  Transportation Needs: Not on file  Physical Activity: Unknown (12/28/2017)   Exercise Vital Sign    Days of Exercise per Week: 0 days    Minutes of Exercise per Session: Not on file  Stress: No Stress Concern Present (12/28/2017)   [employer] of Occupational Health - Occupational Stress Questionnaire    Feeling of Stress : Not at all  Social Connections: Not on file  Intimate Partner Violence: Not on file     Current Outpatient Medications:    drospirenone -ethinyl estradiol  (YAZ) 3-0.02 MG tablet, Take 1 tablet by mouth daily., Disp: 84 tablet, Rfl: 0     ROS:  Review of Systems  Constitutional:  Negative for fatigue, fever and unexpected weight change.  Respiratory:  Negative for cough, shortness of breath and wheezing.   Cardiovascular:  Negative for chest pain, palpitations and leg swelling.  Gastrointestinal:  Negative for blood in stool, constipation, diarrhea, nausea and vomiting.  Endocrine: Negative for cold intolerance, heat intolerance and polyuria.  Genitourinary:  Negative for dyspareunia,  dysuria, flank pain, frequency, genital sores, hematuria, menstrual problem, pelvic pain, urgency, vaginal bleeding, vaginal discharge and vaginal pain.  Musculoskeletal:  Negative for back pain, joint swelling and myalgias.  Skin:  Negative for rash.  Neurological:  Negative for dizziness, syncope, light-headedness, numbness and headaches.  Hematological:  Negative for adenopathy.  Psychiatric/Behavioral:  Negative for agitation, confusion, sleep disturbance and suicidal ideas. The patient is not nervous/anxious.    BREAST: No symptoms   Objective: There were no vitals taken for this visit.   Physical  Exam Constitutional:      Appearance: She is well-developed.  Genitourinary:     Vulva normal.     Right Labia: No rash, tenderness or lesions.    Left Labia: No tenderness, lesions or rash.    No vaginal discharge, erythema or tenderness.      Right Adnexa: not tender and no mass present.    Left Adnexa: not tender and no mass present.    No cervical friability or polyp.     Uterus is not enlarged or tender.  Breasts:    Right: No mass, nipple discharge, skin change or tenderness.     Left: No mass, nipple discharge, skin change or tenderness.  Neck:     Thyroid: No thyromegaly.  Cardiovascular:     Rate and Rhythm: Normal rate and regular rhythm.     Heart sounds: Normal heart sounds. No murmur heard. Pulmonary:     Effort: Pulmonary effort is normal.     Breath sounds: Normal breath sounds.  Abdominal:     Palpations: Abdomen is soft.     Tenderness: There is no abdominal tenderness. There is no guarding or rebound.  Musculoskeletal:        General: Normal range of motion.     Cervical back: Normal range of motion.  Lymphadenopathy:     Cervical: No cervical adenopathy.  Neurological:     General: No focal deficit present.     Mental Status: She is alert and oriented to person, place, and time.     Cranial Nerves: No cranial nerve deficit.  Skin:    General: Skin is warm and dry.  Psychiatric:        Mood and Affect: Mood normal.        Behavior: Behavior normal.        Thought Content: Thought content normal.        Judgment: Judgment normal.  Vitals reviewed.     Assessment/Plan: No diagnosis found.    No orders of the defined types were placed in this encounter.            GYN counsel adequate intake of calcium and vitamin D, diet and exercise     F/U  No follow-ups on file.  Mathis Cashman B. Myrel Rappleye, PA-C 04/09/2024 5:20 PM

## 2024-04-10 ENCOUNTER — Encounter: Payer: Self-pay | Admitting: Obstetrics and Gynecology

## 2024-04-10 ENCOUNTER — Ambulatory Visit: Admitting: Obstetrics and Gynecology

## 2024-04-10 VITALS — BP 104/72 | HR 85 | Ht 60.0 in | Wt 132.0 lb

## 2024-04-10 DIAGNOSIS — Z01419 Encounter for gynecological examination (general) (routine) without abnormal findings: Secondary | ICD-10-CM

## 2024-04-10 DIAGNOSIS — Z1231 Encounter for screening mammogram for malignant neoplasm of breast: Secondary | ICD-10-CM

## 2024-04-10 DIAGNOSIS — Z3041 Encounter for surveillance of contraceptive pills: Secondary | ICD-10-CM

## 2024-04-10 MED ORDER — DROSPIRENONE-ETHINYL ESTRADIOL 3-0.02 MG PO TABS: 1.0000 | ORAL_TABLET | Freq: Every day | ORAL | 3 refills | Status: DC

## 2024-04-10 NOTE — Patient Instructions (Signed)
 I value your feedback and you entrusting [place] with your care. If you get a [facility] patient survey, I would appreciate you taking the time to let us know about your experience today. Thank you!  [facility] ([place])--[phone]

## 2024-04-23 ENCOUNTER — Emergency Department
Admission: EM | Admit: 2024-04-23 | Discharge: 2024-04-23 | Disposition: A | Discharge status: Home / Self Care | Source: Ambulatory Visit

## 2024-04-23 ENCOUNTER — Emergency Department

## 2024-04-23 ENCOUNTER — Other Ambulatory Visit: Payer: Self-pay

## 2024-04-23 DIAGNOSIS — S161XXA Strain of muscle, fascia and tendon at neck level, initial encounter: Secondary | ICD-10-CM | POA: Insufficient documentation

## 2024-04-23 DIAGNOSIS — Y92410 Unspecified street and highway as the place of occurrence of the external cause: Secondary | ICD-10-CM | POA: Diagnosis not present

## 2024-04-23 DIAGNOSIS — S199XXA Unspecified injury of neck, initial encounter: Secondary | ICD-10-CM | POA: Diagnosis present

## 2024-04-23 LAB — POC URINE PREG, ED: Preg Test, Ur: NEGATIVE

## 2024-04-23 MED ORDER — NAPROXEN 500 MG PO TABS: 500.0000 mg | ORAL_TABLET | Freq: Two times a day (BID) | ORAL | 0 refills | Status: AC

## 2024-04-23 MED ORDER — CYCLOBENZAPRINE HCL 5 MG PO TABS: 5.0000 mg | ORAL_TABLET | Freq: Three times a day (TID) | ORAL | 0 refills | Status: AC | PRN
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
## 2024-04-23 NOTE — ED Notes (Signed)
 Pt verbalizes understanding of discharge instructions. Opportunity for questioning and answers were provided. Pt discharged from ED to home.   ? ?

## 2024-08-18 ENCOUNTER — Other Ambulatory Visit: Payer: Self-pay | Admitting: Obstetrics and Gynecology

## 2024-08-18 DIAGNOSIS — Z3041 Encounter for surveillance of contraceptive pills: Secondary | ICD-10-CM
